# Patient Record
Sex: Male | Born: 1960 | Race: Black or African American | Hispanic: No | Smoking: Former smoker
Health system: Southern US, Community
[De-identification: ages and names within clinical notes are randomized; demographics above are authoritative.]

## PROBLEM LIST (undated history)

## (undated) DIAGNOSIS — I10 Essential (primary) hypertension: Secondary | ICD-10-CM

## (undated) DIAGNOSIS — E785 Hyperlipidemia, unspecified: Secondary | ICD-10-CM

## (undated) DIAGNOSIS — G8929 Other chronic pain: Secondary | ICD-10-CM

## (undated) HISTORY — DX: Other chronic pain: G89.29

## (undated) HISTORY — PX: BACK SURGERY: SHX140

## (undated) HISTORY — DX: Hyperlipidemia, unspecified: E78.5

## (undated) HISTORY — PX: HERNIA REPAIR: SHX51

---

## 2003-10-23 ENCOUNTER — Emergency Department (HOSPITAL_COMMUNITY): Admission: EM | Admit: 2003-10-23 | Discharge: 2003-10-23 | Payer: Self-pay | Admitting: Emergency Medicine

## 2003-10-30 ENCOUNTER — Ambulatory Visit (HOSPITAL_COMMUNITY): Admission: RE | Admit: 2003-10-30 | Discharge: 2003-10-30 | Payer: Self-pay | Admitting: Family Medicine

## 2004-06-06 ENCOUNTER — Ambulatory Visit (HOSPITAL_COMMUNITY): Admission: RE | Admit: 2004-06-06 | Discharge: 2004-06-06 | Payer: Self-pay | Admitting: Family Medicine

## 2004-06-13 ENCOUNTER — Ambulatory Visit (HOSPITAL_COMMUNITY): Admission: RE | Admit: 2004-06-13 | Discharge: 2004-06-13 | Payer: Self-pay | Admitting: Family Medicine

## 2004-08-07 ENCOUNTER — Encounter (INDEPENDENT_AMBULATORY_CARE_PROVIDER_SITE_OTHER): Payer: Self-pay | Admitting: Internal Medicine

## 2004-08-07 LAB — CONVERTED CEMR LAB: PSA: NEGATIVE ng/mL

## 2004-08-24 ENCOUNTER — Ambulatory Visit (HOSPITAL_COMMUNITY): Admission: RE | Admit: 2004-08-24 | Discharge: 2004-08-24 | Payer: Self-pay | Admitting: Urology

## 2004-10-04 ENCOUNTER — Ambulatory Visit (HOSPITAL_COMMUNITY): Admission: RE | Admit: 2004-10-04 | Discharge: 2004-10-04 | Payer: Self-pay | Admitting: General Surgery

## 2004-10-05 ENCOUNTER — Emergency Department (HOSPITAL_COMMUNITY): Admission: EM | Admit: 2004-10-05 | Discharge: 2004-10-05 | Payer: Self-pay | Admitting: Emergency Medicine

## 2004-10-10 ENCOUNTER — Ambulatory Visit (HOSPITAL_COMMUNITY): Admission: RE | Admit: 2004-10-10 | Discharge: 2004-10-10 | Payer: Self-pay | Admitting: Urology

## 2004-11-21 ENCOUNTER — Ambulatory Visit (HOSPITAL_COMMUNITY): Admission: RE | Admit: 2004-11-21 | Discharge: 2004-11-21 | Payer: Self-pay | Admitting: Urology

## 2005-01-09 ENCOUNTER — Ambulatory Visit (HOSPITAL_COMMUNITY): Admission: RE | Admit: 2005-01-09 | Discharge: 2005-01-09 | Payer: Self-pay | Admitting: Urology

## 2005-01-22 IMAGING — CR DG LUMBAR SPINE COMPLETE 4+V
5 series · 5 of 5 positions shown · non-contrast
Comparison: none

CLINICAL DATA: Low back pain.
 LUMBAR SPINE FOUR VIEWS
 There are no prior studies for comparison.
 There is no evidence of lumbar spine fracture or subluxation.  Severe degenerative disk disease and facet DJD is seen at L5-S1.  Mild retrolisthesis is seen at L5-S1 measuring approximately 5 mm.  This is attributed to the degenerative changes seen at this level.  
 IMPRESSION
 1.  No acute radiographic findings.
 2.  Severe degenerative disk disease and facet DJD at L5-S1 with grade I retrolisthesis measuring approximately 5 mm.

[view not recorded (1 of 5)]
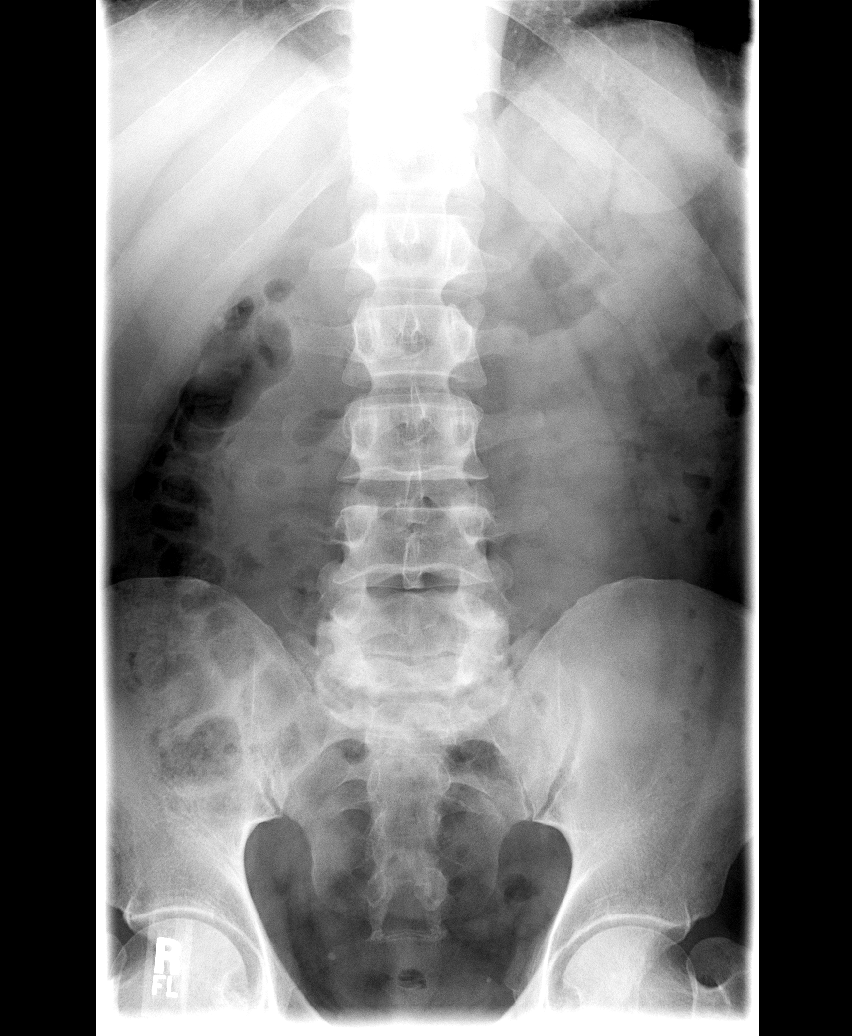

[view not recorded (2 of 5)]
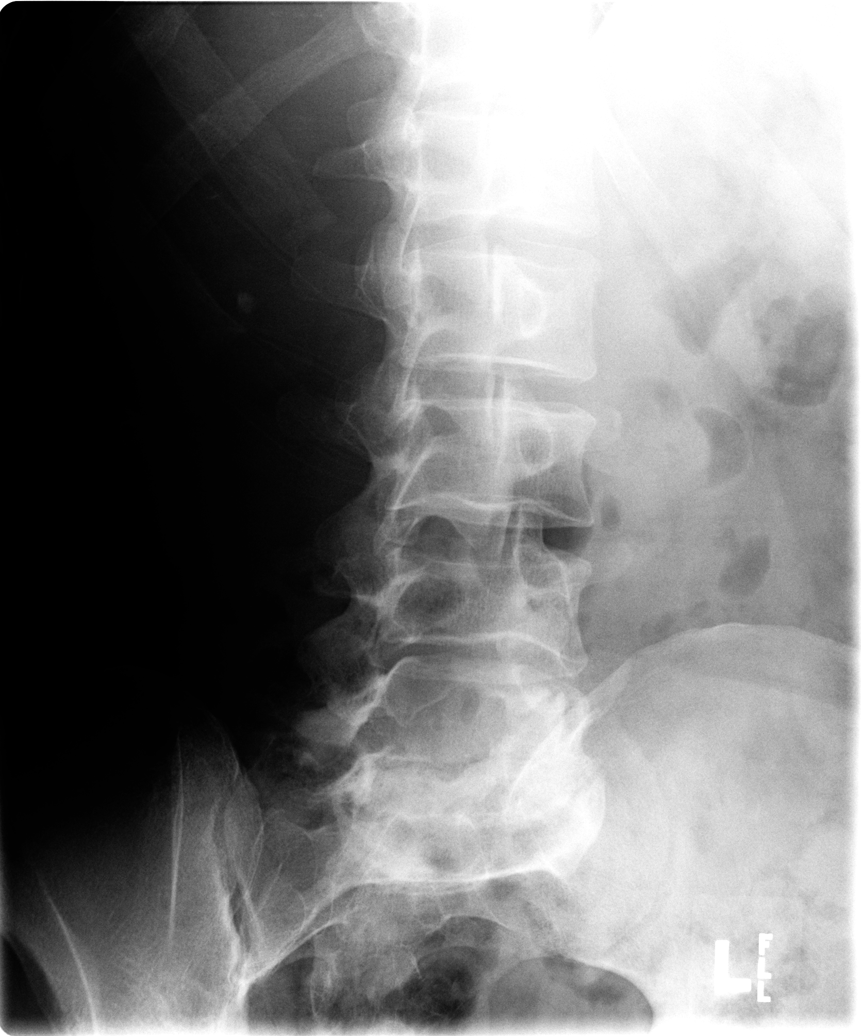

[view not recorded (3 of 5)]
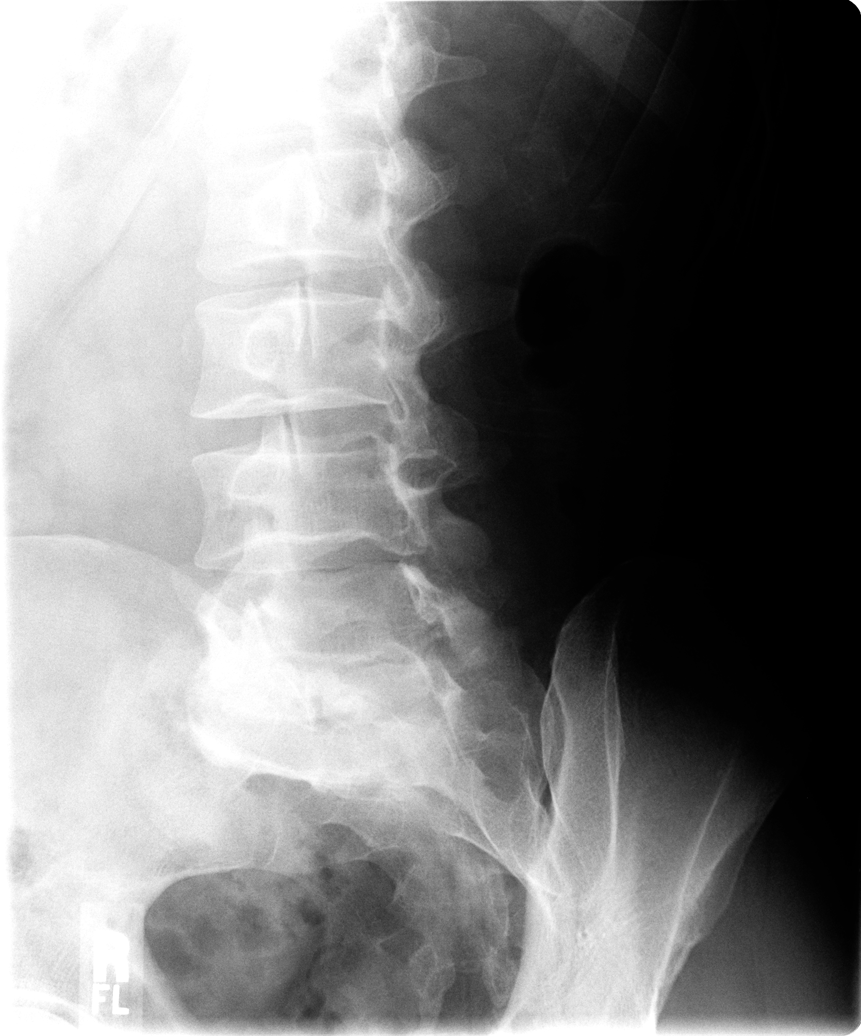

[view not recorded (4 of 5)]
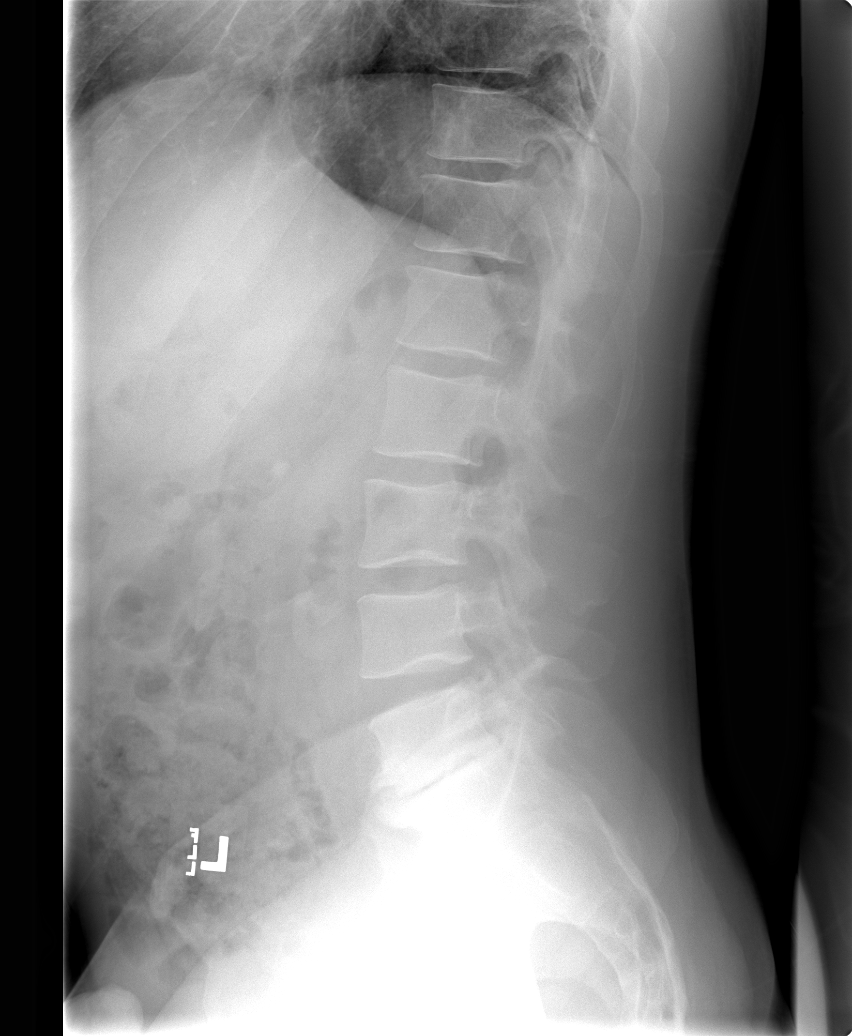

[view not recorded (5 of 5)]
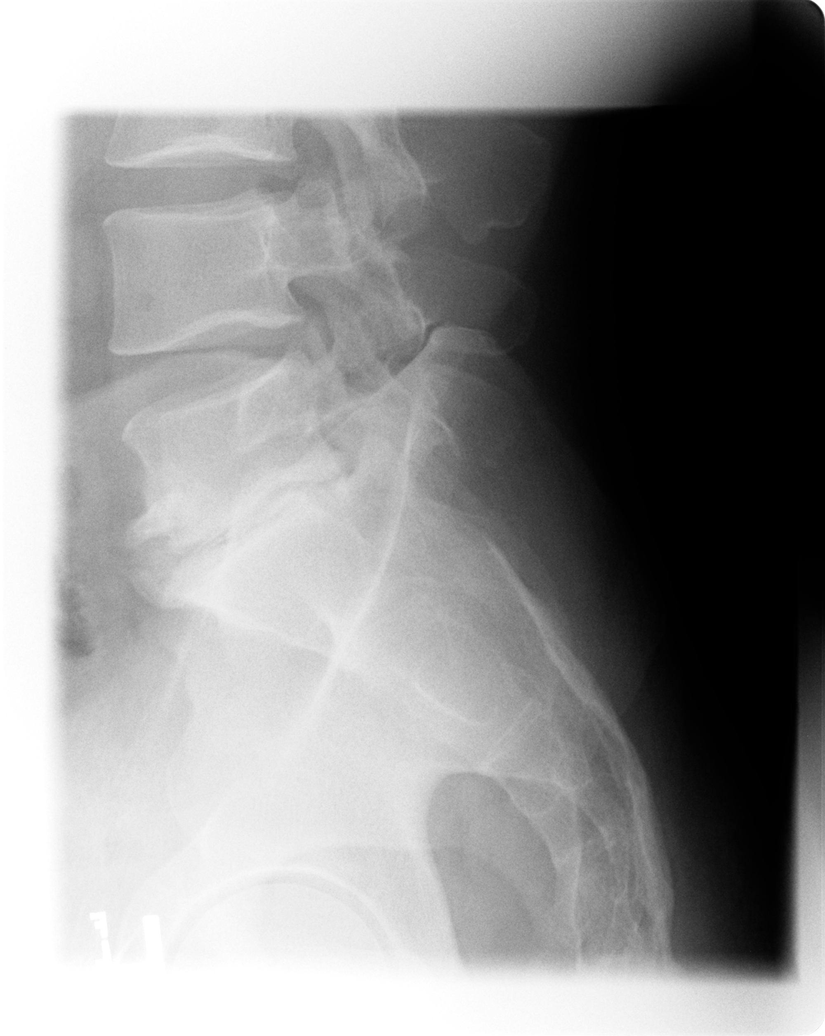

[5 of 5 positions shown; findings below may reference images not displayed]

## 2005-08-23 ENCOUNTER — Ambulatory Visit (HOSPITAL_COMMUNITY): Admission: RE | Admit: 2005-08-23 | Discharge: 2005-08-23 | Payer: Self-pay | Admitting: General Surgery

## 2006-04-12 ENCOUNTER — Emergency Department (HOSPITAL_COMMUNITY): Admission: EM | Admit: 2006-04-12 | Discharge: 2006-04-12 | Payer: Self-pay | Admitting: Emergency Medicine

## 2006-04-20 ENCOUNTER — Ambulatory Visit (HOSPITAL_COMMUNITY): Admission: RE | Admit: 2006-04-20 | Discharge: 2006-04-20 | Payer: Self-pay | Admitting: General Surgery

## 2006-04-30 ENCOUNTER — Ambulatory Visit: Payer: Self-pay | Admitting: Internal Medicine

## 2006-04-30 LAB — CONVERTED CEMR LAB
ALT: 29 units/L
AST: 21 units/L
Albumin: 4.2 g/dL
Alkaline Phosphatase: 152 units/L
Bilirubin, Direct: 0.1 mg/dL
Indirect Bilirubin: 0.7 mg/dL
Total Bilirubin: 0.8 mg/dL
Total Protein: 7.8 g/dL

## 2006-06-15 ENCOUNTER — Ambulatory Visit: Payer: Self-pay | Admitting: Internal Medicine

## 2006-06-15 LAB — CONVERTED CEMR LAB
Ketones, ur: NEGATIVE mg/dL
Leukocytes, UA: NEGATIVE
Nitrite: NEGATIVE
Specific Gravity, Urine: 1.025
Urine Glucose: NEGATIVE mg/dL
Urobilinogen, UA: 2
pH: 6.5

## 2006-08-06 ENCOUNTER — Emergency Department (HOSPITAL_COMMUNITY): Admission: EM | Admit: 2006-08-06 | Discharge: 2006-08-06 | Payer: Self-pay | Admitting: Emergency Medicine

## 2006-08-27 ENCOUNTER — Emergency Department (HOSPITAL_COMMUNITY): Admission: EM | Admit: 2006-08-27 | Discharge: 2006-08-27 | Payer: Self-pay | Admitting: Emergency Medicine

## 2006-09-03 ENCOUNTER — Encounter: Payer: Self-pay | Admitting: Internal Medicine

## 2006-09-03 DIAGNOSIS — M545 Low back pain, unspecified: Secondary | ICD-10-CM | POA: Insufficient documentation

## 2006-09-03 DIAGNOSIS — I10 Essential (primary) hypertension: Secondary | ICD-10-CM | POA: Insufficient documentation

## 2006-09-03 DIAGNOSIS — E785 Hyperlipidemia, unspecified: Secondary | ICD-10-CM | POA: Insufficient documentation

## 2006-10-12 ENCOUNTER — Ambulatory Visit: Payer: Self-pay | Admitting: Internal Medicine

## 2006-10-12 DIAGNOSIS — Z720 Tobacco use: Secondary | ICD-10-CM | POA: Insufficient documentation

## 2006-10-12 DIAGNOSIS — F17200 Nicotine dependence, unspecified, uncomplicated: Secondary | ICD-10-CM | POA: Insufficient documentation

## 2006-10-16 LAB — CONVERTED CEMR LAB
BUN: 21 mg/dL (ref 6–23)
CO2: 23 meq/L (ref 19–32)
Calcium: 9.7 mg/dL (ref 8.4–10.5)
Chloride: 102 meq/L (ref 96–112)
Cholesterol: 168 mg/dL (ref 0–200)
Creatinine, Ser: 1.13 mg/dL (ref 0.40–1.50)
Glucose, Bld: 193 mg/dL — ABNORMAL HIGH (ref 70–99)
HDL: 32 mg/dL — ABNORMAL LOW (ref >39)
LDL Cholesterol: 112 mg/dL — ABNORMAL HIGH (ref 0–99)
Potassium: 3.4 meq/L — ABNORMAL LOW (ref 3.5–5.3)
Sodium: 140 meq/L (ref 135–145)
Total CHOL/HDL Ratio: 5.3
Triglycerides: 119 mg/dL (ref <150)
VLDL: 24 mg/dL (ref 0–40)

## 2006-10-31 ENCOUNTER — Encounter (INDEPENDENT_AMBULATORY_CARE_PROVIDER_SITE_OTHER): Payer: Self-pay | Admitting: Internal Medicine

## 2006-11-01 LAB — CONVERTED CEMR LAB
Glucose, Bld: 93 mg/dL (ref 70–99)
Potassium: 4.1 meq/L (ref 3.5–5.3)

## 2007-03-12 ENCOUNTER — Ambulatory Visit: Payer: Self-pay | Admitting: Internal Medicine

## 2007-03-13 ENCOUNTER — Telehealth (INDEPENDENT_AMBULATORY_CARE_PROVIDER_SITE_OTHER): Payer: Self-pay | Admitting: *Deleted

## 2007-03-13 ENCOUNTER — Encounter (INDEPENDENT_AMBULATORY_CARE_PROVIDER_SITE_OTHER): Payer: Self-pay | Admitting: Internal Medicine

## 2007-03-13 LAB — CONVERTED CEMR LAB
BUN: 11 mg/dL (ref 6–23)
CO2: 24 meq/L (ref 19–32)
Calcium: 9.6 mg/dL (ref 8.4–10.5)
Chloride: 103 meq/L (ref 96–112)
Creatinine, Ser: 1.12 mg/dL (ref 0.40–1.50)
Glucose, Bld: 85 mg/dL (ref 70–99)
Potassium: 3.7 meq/L (ref 3.5–5.3)
Sodium: 139 meq/L (ref 135–145)

## 2009-02-05 ENCOUNTER — Encounter (INDEPENDENT_AMBULATORY_CARE_PROVIDER_SITE_OTHER): Payer: Self-pay | Admitting: Internal Medicine

## 2010-08-27 ENCOUNTER — Encounter: Payer: Self-pay | Admitting: Family Medicine

## 2010-09-06 ENCOUNTER — Emergency Department (HOSPITAL_COMMUNITY)
Admission: EM | Admit: 2010-09-06 | Discharge: 2010-09-06 | Payer: Self-pay | Source: Home / Self Care | Admitting: Emergency Medicine

## 2010-09-06 LAB — URINALYSIS, ROUTINE W REFLEX MICROSCOPIC
Bilirubin Urine: NEGATIVE
Ketones, ur: NEGATIVE mg/dL
Leukocytes, UA: NEGATIVE
Nitrite: NEGATIVE
Protein, ur: NEGATIVE mg/dL
Specific Gravity, Urine: <1.005 — ABNORMAL LOW (ref 1.005–1.030)
Urine Glucose, Fasting: NEGATIVE mg/dL
Urobilinogen, UA: 0.2 mg/dL (ref 0.0–1.0)
pH: 5.5 (ref 5.0–8.0)

## 2010-09-06 LAB — DIFFERENTIAL
Basophils Absolute: 0 10*3/uL (ref 0.0–0.1)
Basophils Relative: 1 % (ref 0–1)
Eosinophils Absolute: 0.1 10*3/uL (ref 0.0–0.7)
Eosinophils Relative: 2 % (ref 0–5)
Lymphocytes Relative: 40 % (ref 12–46)
Lymphs Abs: 2.4 10*3/uL (ref 0.7–4.0)
Monocytes Absolute: 0.7 10*3/uL (ref 0.1–1.0)
Monocytes Relative: 12 % (ref 3–12)
Neutro Abs: 2.8 10*3/uL (ref 1.7–7.7)
Neutrophils Relative %: 47 % (ref 43–77)

## 2010-09-06 LAB — URINE MICROSCOPIC-ADD ON

## 2010-09-06 LAB — CBC
HCT: 44.6 % (ref 39.0–52.0)
Hemoglobin: 15.8 g/dL (ref 13.0–17.0)
MCH: 33.1 pg (ref 26.0–34.0)
MCHC: 35.4 g/dL (ref 30.0–36.0)
MCV: 93.5 fL (ref 78.0–100.0)
Platelets: 247 10*3/uL (ref 150–400)
RBC: 4.77 MIL/uL (ref 4.22–5.81)
RDW: 12.6 % (ref 11.5–15.5)
WBC: 6 10*3/uL (ref 4.0–10.5)

## 2010-09-06 LAB — BASIC METABOLIC PANEL
BUN: 15 mg/dL (ref 6–23)
CO2: 25 mEq/L (ref 19–32)
Calcium: 9.3 mg/dL (ref 8.4–10.5)
Chloride: 102 mEq/L (ref 96–112)
Creatinine, Ser: 1.14 mg/dL (ref 0.4–1.5)
GFR calc Af Amer: >60 mL/min (ref >60)
GFR calc non Af Amer: >60 mL/min (ref >60)
Glucose, Bld: 98 mg/dL (ref 70–99)
Potassium: 3.3 mEq/L — ABNORMAL LOW (ref 3.5–5.1)
Sodium: 137 mEq/L (ref 135–145)

## 2010-09-06 LAB — RAPID URINE DRUG SCREEN, HOSP PERFORMED
Amphetamines: NOT DETECTED
Barbiturates: NOT DETECTED
Benzodiazepines: NOT DETECTED
Cocaine: NOT DETECTED
Opiates: NOT DETECTED
Tetrahydrocannabinol: NOT DETECTED

## 2010-09-06 LAB — ETHANOL: Alcohol, Ethyl (B): 79 mg/dL — ABNORMAL HIGH (ref 0–10)

## 2010-12-23 NOTE — Op Note (Signed)
NAME:  Antonio Small, Antonio Small              ACCOUNT NO.:  [id number]   MEDICAL RECORD NO.:  [id number]          PATIENT TYPE:  AMB   LOCATION:  DAY                           FACILITY:  [facility]   PHYSICIAN:  Jerolyn Shin C. Katrinka Blazing, M.D.   DATE OF BIRTH:  1960-10-13   DATE OF PROCEDURE:  10/04/2004  DATE OF DISCHARGE:  10/04/2004                                 OPERATIVE REPORT   PREOPERATIVE DIAGNOSIS:  Left inguinal hernia.   POSTOPERATIVE DIAGNOSIS:  Left inguinal hernia,   PROCEDURE:  Left inguinal hernia repair.   SURGEON:  Elpidio Anis, M.D.   DESCRIPTION:  Under general LMA anesthesia, the patient's left inguinal area  and genitalia were prepped and draped in a sterile field.  A curvilinear  lower inguinal incision was made.  Incision extended to the aponeurosis.  The aponeurosis was opened in the line of its fibers through the internal  ring and external ring.  The cord was mobilized from the floor.  The hernia  sac was identified and was bluntly dissected from the cord.  This dissection  was carried back to the internal ring.  The cord was invaginated and a  medium plug was placed in the internal ring.  The plug was sutured in place  with interrupted 0 Prolene.  Next, an onlay patch was placed under the cord  and was sutured to with 0 Prolene.  The aponeurosis was closed with running  3-0 Monocryl.  The subcutaneous tissue closed with running 3-0 Monocryl.  The skin was closed with 4-0 Dexon.  The wound was infiltrated with 20 mL of  Marcaine 0.5% with epinephrine 1:200,000.  OpSite dressing was placed.  The  patient tolerated the procedure well.  He was transferred to a bed and taken  to the postanesthetic care unit for monitoring.      LCS/MEDQ  D:  11/06/2004  T:  11/07/2004  Job:  [id number]

## 2010-12-23 NOTE — Op Note (Signed)
NAME:  Antonio Small, Antonio Small              ACCOUNT NO.:  [id number]   MEDICAL RECORD NO.:  [id number]          PATIENT TYPE:  AMB   LOCATION:  DAY                           FACILITY:  [facility]   PHYSICIAN:  Dalia Heading, M.D.  DATE OF BIRTH:  1961-01-31   DATE OF PROCEDURE:  04/20/2006  DATE OF DISCHARGE:                                 OPERATIVE REPORT   PREOPERATIVE DIAGNOSIS:  Recurrent left inguinal hernia.   POSTOPERATIVE DIAGNOSIS:  Recurrent left inguinal hernia, granuloma.   PROCEDURE:  Recurrent left inguinal herniorrhaphy.   SURGEON:  Dr. Franky Macho.   ANESTHESIA:  General endotracheal.   INDICATIONS:  The patient is a 50 year old black male status post left  inguinal herniorrhaphy in the past who presents with a recurrent mass and  pain in the left inguinal region.  The risks and benefits of the procedure  including bleeding, pain, infection, and recurrence of the hernia were fully  explained to the patient, who gave informed consent.   PROCEDURE NOTE:  The patient was placed in supine position.  After induction  of general endotracheal anesthesia, the left groin region was prepped and  draped using the usual sterile technique with Betadine.  Surgical site  confirmation was performed.   A left groin incision was made down to the external oblique aponeurosis.  The aponeurosis was incised.  The mass that was palpable was noted to be the  polypropylene mesh plug that had been previously placed in the internal  ring.  Granulomatous tissue and old Prolene sutures were found and removed.  The plug was removed as much as I could to get rid of the granulomatous  tissue.  The internal ring was then closed using 2-0 Novofil interrupted  sutures.  The external oblique aponeurosis was reapproximated using a 2-0  Vicryl running suture, and subcutaneous layer was closed using a 3-0 Vicryl  interrupted suture.  The skin was closed using a 4-0 Vicryl subcuticular  suture.  Sensorcaine  0.5% was instilled into the surrounding wound.  Dermabond was then applied.   All tape and needle counts were correct at the end of the procedure.  The  patient was extubated in the operating room and went back to recovery room  awake in stable condition.   COMPLICATIONS:  None.   SPECIMEN:  None.   BLOOD LOSS:  Minimal.      Dalia Heading, M.D.  Electronically Signed     MAJ/MEDQ  D:  04/20/2006  T:  04/21/2006  Job:  [id number]   cc:   Kandice Hams

## 2010-12-23 NOTE — H&P (Signed)
NAME:  Antonio Small, COUPE              ACCOUNT NO.:  [id number]   MEDICAL RECORD NO.:  [id number]          PATIENT TYPE:  AMB   LOCATION:  DAY                           FACILITY:  [facility]   PHYSICIAN:  Jerolyn Shin C. Katrinka Blazing, M.D.   DATE OF BIRTH:  03-21-1961   DATE OF ADMISSION:  DATE OF DISCHARGE:  LH                                HISTORY & PHYSICAL   HISTORY OF PRESENT ILLNESS:  A 50 year old male with history of left  inguinal swelling.  The swelling has become more prominent, and he has had  more pain.  When seen in the office, he was noted to have a left inguinal  hernia.  He is scheduled to have hernia repair.   PAST HISTORY:  1.  He has hypertension.  2.  Chronic low back pain due to lumbar disk disease.  3.  History of kidney stones.  4.  Hyperlipidemia.   MEDICATIONS:  1.  Diovan 100/25 daily.  2.  Lortab 7.5 mg t.i.d.  3.  Wellbutrin SR 150 mg b.i.d.  4.  Aspirin 81 mg daily.  5.  Lotrel 5/10 mg daily.  6.  Levitra 20 mg p.r.n.   ALLERGIES:  None.   FAMILY HISTORY:  Positive for coronary artery disease, stroke, and dementia.   PHYSICAL EXAMINATION:  VITAL SIGNS:  Blood pressure 120/80, pulse 88,  respirations 20, weight 172 pounds.  HEENT:  Unremarkable.  NECK:  Supple.  No JVD, bruit, adenopathy, or thyromegaly.  CHEST:  Clear to auscultation.  HEART:  Regular rate and rhythm without murmur, gallop, or rub.  ABDOMEN:  Soft, nontender.  No masses.  Moderate-sized left inguinal hernia  without incarceration.  EXTREMITIES:  No clubbing, cyanosis, or edema.  BACK:  A healed lumbar scar with tenderness of the lower lumbar muscle  bilaterally.  NEUROLOGIC:  No focal motor, sensory, or cerebellar deficits.   IMPRESSION:  1.  Left inguinal hernia.  2.  Hypertension.  3.  Chronic low back pain due to lumbar disk disease.  4.  History of kidney stones.  5.  Hyperlipidemia.   PLAN:  Left inguinal hernia repair.      LCS/MEDQ  D:  10/03/2004  T:  10/04/2004  Job:  [id number]

## 2010-12-23 NOTE — H&P (Signed)
NAME:  Antonio Small, Antonio Small              ACCOUNT NO.:  [id number]   MEDICAL RECORD NO.:  [id number]          PATIENT TYPE:  AMB   LOCATION:  DAY                           FACILITY:  [facility]   PHYSICIAN:  Dalia Heading, M.D.  DATE OF BIRTH:  1960-12-05   DATE OF ADMISSION:  04/20/2006  DATE OF DISCHARGE:  LH                                HISTORY & PHYSICAL   CHIEF COMPLAINT:  Recurrent left inguinal hernia.   HISTORY OF PRESENT ILLNESS:  The patient is a 50 year old black male, who is  referred for evaluation and treatment of a left inguinal hernia.  He was  seen in the emergency room last week, and the hernia was reduced.  He is  status post a left inguinal herniorrhaphy by Dr. Katrinka Blazing in the remote past.  He is still having some pain in that region currently.  The bulge is made  worse with straining.   PAST MEDICAL HISTORY:  Includes hypertension and chronic back pain.   PAST SURGICAL HISTORY:  Back surgery, left inguinal herniorrhaphy.   CURRENT MEDICATIONS:  A blood pressure pill.   ALLERGIES:  No known drug allergies.   REVIEW OF SYSTEMS:  The patient smokes 2 pack of cigarettes a day.  He  drinks alcohol socially.   PHYSICAL EXAMINATION:  GENERAL:  The patient is a well-developed, well-  nourished black male in no acute distress.  LUNGS:  Clear to auscultation with equal breath sounds bilaterally.  HEART:  Regular rate and rhythm without S3, S4, or murmurs.  ABDOMEN:  The abdomen is soft and nondistended.  He is tender in the left  groin region with a left inguinal hernia present.  No hepatosplenomegaly or  masses are noted.   IMPRESSION:  Recurrent left inguinal hernia.   PLAN:  The patient is scheduled for a recurrent left inguinal herniorrhaphy  on April 20, 2006.  The risks and benefits of the procedure including  bleeding, infection, and recurrence of the hernia were fully explained to  the patient, gave informed consent.      Dalia Heading, M.D.  Electronically Signed     MAJ/MEDQ  D:  04/17/2006  T:  04/17/2006  Job:  [id number]   cc:   [facility]  Fax: [phone]   Erle Crocker, MD

## 2012-06-26 ENCOUNTER — Emergency Department (HOSPITAL_COMMUNITY)
Admission: EM | Admit: 2012-06-26 | Discharge: 2012-06-26 | Disposition: A | Discharge status: Home / Self Care | Payer: Medicare Other | Attending: Emergency Medicine | Admitting: Emergency Medicine

## 2012-06-26 ENCOUNTER — Encounter (HOSPITAL_COMMUNITY): Payer: Self-pay | Admitting: *Deleted

## 2012-06-26 ENCOUNTER — Emergency Department (HOSPITAL_COMMUNITY): Payer: Medicare Other

## 2012-06-26 DIAGNOSIS — F17200 Nicotine dependence, unspecified, uncomplicated: Secondary | ICD-10-CM | POA: Insufficient documentation

## 2012-06-26 DIAGNOSIS — R6883 Chills (without fever): Secondary | ICD-10-CM | POA: Insufficient documentation

## 2012-06-26 DIAGNOSIS — I10 Essential (primary) hypertension: Secondary | ICD-10-CM | POA: Insufficient documentation

## 2012-06-26 DIAGNOSIS — J40 Bronchitis, not specified as acute or chronic: Secondary | ICD-10-CM

## 2012-06-26 HISTORY — DX: Essential (primary) hypertension: I10

## 2012-06-26 MED ORDER — AMOXICILLIN 500 MG PO CAPS: 500.0000 mg | ORAL_CAPSULE | Freq: Three times a day (TID) | ORAL | Status: DC

## 2012-06-26 MED ORDER — ALBUTEROL SULFATE HFA 108 (90 BASE) MCG/ACT IN AERS
2.0000 | INHALATION_SPRAY | RESPIRATORY_TRACT | Status: DC | PRN
  Administered 2012-06-26: 2 via RESPIRATORY_TRACT
  Filled 2012-06-26: qty 6.7

## 2012-06-26 MED ORDER — ACETAMINOPHEN 500 MG PO TABS
ORAL_TABLET | ORAL | Status: AC
  Administered 2012-06-26: 1000 mg
  Filled 2012-06-26: qty 2

## 2012-06-26 NOTE — ED Notes (Signed)
Cough, fever, body aches for 3 weeks, fever, NVD,

## 2012-06-26 NOTE — ED Notes (Signed)
Patient transported to X-ray 

## 2012-06-26 NOTE — ED Provider Notes (Signed)
History   This chart was scribed for Antonio Lennert, MD by Antonio Small, ED Scribe. This patient was seen in room APA11/[facility]11 and the patient's care was started at 3:26 PM.   CSN: [id number]  Arrival date & time 06/26/12  1328   None     Chief Complaint  Patient presents with  . Cough    Patient is a 51 y.o. male presenting with cough. The history is provided by the patient. No language interpreter was used.  Cough The current episode started more than 1 week ago. The problem occurs hourly. The problem has not changed since onset.The cough is productive of sputum. Associated symptoms include chills, shortness of breath and wheezing. Pertinent negatives include no chest pain and no headaches. He has tried nothing for the symptoms. He is a smoker.    Antonio Small is a 51 y.o. male who presents to the Emergency Department complaining of unchanged, gradual onset, intermittent productive of sputum cough onset three weeks ago. There is associated fever, SOB, wheezing, chills and body aches. Patient denies abdominal pain and back pain. He denies any other associated symptoms and complaints. Patient otherwise healthy. He states that he has not been smoking for 3 weeks but he used to smoke a pack a day.  He denies seeing his PCP for his symptoms.    Past Medical History  Diagnosis Date  . Hypertension     Past Surgical History  Procedure Date  . Back surgery   . Hernia repair     History reviewed. No pertinent family history.  History  Substance Use Topics  . Smoking status: Current Every Day Smoker  . Smokeless tobacco: Not on file  . Alcohol Use: Yes      Review of Systems  Constitutional: Positive for fever and chills. Negative for fatigue.  HENT: Negative for congestion, sinus pressure and ear discharge.   Eyes: Negative for discharge.  Respiratory: Positive for shortness of breath and wheezing. Negative for cough.   Cardiovascular: Negative for chest pain.    Gastrointestinal: Negative for nausea, vomiting, abdominal pain and diarrhea.  Genitourinary: Negative for frequency and hematuria.  Musculoskeletal: Negative for back pain.  Skin: Negative for rash.  Neurological: Negative for seizures and headaches.  Hematological: Negative.   Psychiatric/Behavioral: Negative for hallucinations.    Allergies  Review of patient's allergies indicates no known allergies.  Home Medications  No current outpatient prescriptions on file.  BP 138/71  Pulse 107  Temp 103.3 F (39.6 C) (Oral)  Resp 22  Ht 5\' 9"  (1.753 m)  Wt 165 lb (74.844 kg)  BMI 24.37 kg/m2  SpO2 100%  Physical Exam  Nursing note and vitals reviewed. Constitutional: He is oriented to person, place, and time. He appears well-developed and well-nourished.  HENT:  Head: Normocephalic and atraumatic.  Eyes: Conjunctivae normal and EOM are normal. Pupils are equal, round, and reactive to light.  Neck: Normal range of motion. Neck supple.  Cardiovascular: Normal rate, regular rhythm and normal heart sounds.   Pulmonary/Chest: He has wheezes.       Minimal wheezing bilaterally.  Abdominal: Soft. Bowel sounds are normal.  Musculoskeletal: Normal range of motion.  Neurological: He is alert and oriented to person, place, and time.  Skin: Skin is warm and dry.  Psychiatric: He has a normal mood and affect.    ED Course  Procedures (including critical care time)  DIAGNOSTIC STUDIES: Oxygen Saturation is 100% on room air, normal by my interpretation.  COORDINATION OF CARE: 3:25 PM Discussed treatment plan with pt at bedside and pt agreed to plan.   Labs Reviewed - No data to display Dg Chest 2 View  06/26/2012  *RADIOLOGY REPORT*  Clinical Data: Cough and shortness of breath.  Abdominal pain and fever for 3 weeks.  Smoker.  CHEST - 2 VIEW  Comparison: 01/17/7 and CT of 09/06/2010  Findings: Lateral view mildly obliqued.  Emphysema, greater on the left than right.  Upper lobe  predominant and biapical pleural parenchymal scarring.  Midline trachea.  Normal heart size and mediastinal contours. No pleural effusion or pneumothorax.  IMPRESSION: Emphysema and scarring. No acute superimposed process.   Original Report Authenticated By: Jeronimo Greaves, M.D.      No diagnosis found.    MDM  The chart was scribed for me under my direct supervision.  I personally performed the history, physical, and medical decision making and all procedures in the evaluation of this patient.Antonio Lennert, MD 06/26/12 1600

## 2019-10-18 ENCOUNTER — Ambulatory Visit: Payer: Medicare Other | Attending: Internal Medicine

## 2019-10-18 DIAGNOSIS — Z23 Encounter for immunization: Secondary | ICD-10-CM

## 2019-10-18 NOTE — Progress Notes (Signed)
   Covid-19 Vaccination Clinic  Name:  Antonio Small    MRN: [id number] DOB: Dec 10, 1960  10/18/2019  Mr. Antonio Small was observed post Covid-19 immunization for 15 minutes without incident. He was provided with Vaccine Information Sheet and instruction to access the V-Safe system.   Mr. Antonio Small was instructed to call 911 with any severe reactions post vaccine: Marland Kitchen Difficulty breathing  . Swelling of face and throat  . A fast heartbeat  . A bad rash all over body  . Dizziness and weakness   Immunizations Administered    Name Date Dose VIS Date Route   Moderna COVID-19 Vaccine 10/18/2019  9:36 AM 0.5 mL 07/08/2019 Intramuscular   Manufacturer: Moderna   Lot: [id number]   NDC: 03128-118-86

## 2019-11-19 ENCOUNTER — Ambulatory Visit: Payer: Medicare Other | Attending: Internal Medicine

## 2019-11-19 DIAGNOSIS — Z23 Encounter for immunization: Secondary | ICD-10-CM

## 2019-11-19 NOTE — Progress Notes (Signed)
   Covid-19 Vaccination Clinic  Name:  Antonio Small    MRN: [id number] DOB: Feb 13, 1961  11/19/2019  Mr. Antonio Small was observed post Covid-19 immunization for 15 minutes without incident. He was provided with Vaccine Information Sheet and instruction to access the V-Safe system.   Mr. Antonio Small was instructed to call 911 with any severe reactions post vaccine: Marland Kitchen Difficulty breathing  . Swelling of face and throat  . A fast heartbeat  . A bad rash all over body  . Dizziness and weakness   Immunizations Administered    Name Date Dose VIS Date Route   Moderna COVID-19 Vaccine 11/19/2019  8:47 AM 0.5 mL 07/08/2019 Intramuscular   Manufacturer: Moderna   Lot: [id number]   NDC: 60600-459-97

## 2020-01-10 ENCOUNTER — Emergency Department (HOSPITAL_COMMUNITY): Payer: Medicaid Other

## 2020-01-10 ENCOUNTER — Emergency Department (HOSPITAL_COMMUNITY)
Admission: EM | Admit: 2020-01-10 | Discharge: 2020-01-10 | Disposition: A | Discharge status: Home / Self Care | Payer: Medicaid Other | Attending: Emergency Medicine | Admitting: Emergency Medicine

## 2020-01-10 ENCOUNTER — Encounter (HOSPITAL_COMMUNITY): Payer: Self-pay | Admitting: Emergency Medicine

## 2020-01-10 ENCOUNTER — Ambulatory Visit
Admission: EM | Admit: 2020-01-10 | Discharge: 2020-01-10 | Disposition: A | Discharge status: Home / Self Care | Payer: Medicaid Other

## 2020-01-10 ENCOUNTER — Other Ambulatory Visit: Payer: Self-pay

## 2020-01-10 DIAGNOSIS — F17210 Nicotine dependence, cigarettes, uncomplicated: Secondary | ICD-10-CM | POA: Insufficient documentation

## 2020-01-10 DIAGNOSIS — Z20822 Contact with and (suspected) exposure to covid-19: Secondary | ICD-10-CM | POA: Insufficient documentation

## 2020-01-10 DIAGNOSIS — R42 Dizziness and giddiness: Secondary | ICD-10-CM | POA: Insufficient documentation

## 2020-01-10 DIAGNOSIS — I10 Essential (primary) hypertension: Secondary | ICD-10-CM | POA: Diagnosis present

## 2020-01-10 DIAGNOSIS — I160 Hypertensive urgency: Secondary | ICD-10-CM | POA: Diagnosis present

## 2020-01-10 LAB — CBC WITH DIFFERENTIAL/PLATELET
Abs Immature Granulocytes: 0 10*3/uL (ref 0.00–0.07)
Basophils Absolute: 0 10*3/uL (ref 0.0–0.1)
Basophils Relative: 1 %
Eosinophils Absolute: 0.2 10*3/uL (ref 0.0–0.5)
Eosinophils Relative: 3 %
HCT: 46.8 % (ref 39.0–52.0)
Hemoglobin: 15.3 g/dL (ref 13.0–17.0)
Immature Granulocytes: 0 %
Lymphocytes Relative: 32 %
Lymphs Abs: 1.6 10*3/uL (ref 0.7–4.0)
MCH: 31.8 pg (ref 26.0–34.0)
MCHC: 32.7 g/dL (ref 30.0–36.0)
MCV: 97.3 fL (ref 80.0–100.0)
Monocytes Absolute: 0.7 10*3/uL (ref 0.1–1.0)
Monocytes Relative: 15 %
Neutro Abs: 2.4 10*3/uL (ref 1.7–7.7)
Neutrophils Relative %: 49 %
Platelets: 261 10*3/uL (ref 150–400)
RBC: 4.81 MIL/uL (ref 4.22–5.81)
RDW: 13.2 % (ref 11.5–15.5)
WBC: 4.8 10*3/uL (ref 4.0–10.5)
nRBC: 0 % (ref 0.0–0.2)

## 2020-01-10 LAB — SARS CORONAVIRUS 2 BY RT PCR (HOSPITAL ORDER, PERFORMED IN ~~LOC~~ HOSPITAL LAB): SARS Coronavirus 2: NEGATIVE

## 2020-01-10 LAB — URINALYSIS, ROUTINE W REFLEX MICROSCOPIC
Bacteria, UA: NONE SEEN
Bilirubin Urine: NEGATIVE
Glucose, UA: NEGATIVE mg/dL
Hgb urine dipstick: NEGATIVE
Ketones, ur: NEGATIVE mg/dL
Leukocytes,Ua: NEGATIVE
Nitrite: NEGATIVE
Protein, ur: 100 mg/dL — AB
Specific Gravity, Urine: 1.017 (ref 1.005–1.030)
pH: 6 (ref 5.0–8.0)

## 2020-01-10 LAB — COMPREHENSIVE METABOLIC PANEL
ALT: 18 U/L (ref 0–44)
AST: 22 U/L (ref 15–41)
Albumin: 3.6 g/dL (ref 3.5–5.0)
Alkaline Phosphatase: 97 U/L (ref 38–126)
Anion gap: 9 (ref 5–15)
BUN: 15 mg/dL (ref 6–20)
CO2: 28 mmol/L (ref 22–32)
Calcium: 9 mg/dL (ref 8.9–10.3)
Chloride: 103 mmol/L (ref 98–111)
Creatinine, Ser: 1.16 mg/dL (ref 0.61–1.24)
GFR calc Af Amer: >60 mL/min (ref >60)
GFR calc non Af Amer: >60 mL/min (ref >60)
Glucose, Bld: 110 mg/dL — ABNORMAL HIGH (ref 70–99)
Potassium: 3.2 mmol/L — ABNORMAL LOW (ref 3.5–5.1)
Sodium: 140 mmol/L (ref 135–145)
Total Bilirubin: 0.8 mg/dL (ref 0.3–1.2)
Total Protein: 7.2 g/dL (ref 6.5–8.1)

## 2020-01-10 LAB — TROPONIN I (HIGH SENSITIVITY)
Troponin I (High Sensitivity): 4 ng/L (ref <18)
Troponin I (High Sensitivity): 4 ng/L (ref <18)

## 2020-01-10 LAB — ETHANOL: Alcohol, Ethyl (B): <10 mg/dL (ref <10)

## 2020-01-10 MED ORDER — PRAVASTATIN SODIUM 40 MG PO TABS: 40.0000 mg | ORAL_TABLET | Freq: Every morning | ORAL | 0 refills | Status: DC

## 2020-01-10 MED ORDER — LISINOPRIL-HYDROCHLOROTHIAZIDE 20-12.5 MG PO TABS: 1.0000 | ORAL_TABLET | Freq: Every morning | ORAL | 0 refills | Status: DC

## 2020-01-10 MED ORDER — LISINOPRIL-HYDROCHLOROTHIAZIDE 20-25 MG PO TABS: 1.0000 | ORAL_TABLET | Freq: Every day | ORAL | 0 refills | Status: DC

## 2020-01-10 MED ORDER — LABETALOL HCL 5 MG/ML IV SOLN
5.0000 mg | Freq: Once | INTRAVENOUS | Status: AC
  Administered 2020-01-10: 5 mg via INTRAVENOUS
  Filled 2020-01-10: qty 4

## 2020-01-10 MED ORDER — AMLODIPINE BESYLATE 5 MG PO TABS: 5.0000 mg | ORAL_TABLET | Freq: Every morning | ORAL | 0 refills | Status: DC

## 2020-01-10 NOTE — Consult Note (Signed)
Consult note  ESA RADEN RCV:[id number] DOB: 07-15-1961 DOA: 01/10/2020  Referring physician: Dr Jacqulyn Bath, ED physician PCP: Oval Linsey, MD  Outpatient Specialists:   Patient Coming From: home  Chief Complaint: headache, blurred vision  HPI: Antonio Small is a 59 y.o. male with a history of hypertension.  Recently released from jail about 8 months ago and has not had his blood pressure medications since that time.  He has a longstanding history of headache and blurred vision.  He states that he needs glasses, but has not been able to afford them.  Since being released from jail, patient has not had his blood pressure medication.  Today, he had severe headache and worsening blurred vision.  He had his blood pressure taken at the food kitchen, which was extremely elevated.  He was sent to the emergency department for evaluation.  Patient denied alcohol use.  Emergency Department Course: Initial blood pressure 220/120.  Patient given labetalol 5 mg IV with good response of blood pressure in the 150s over 102.  Labs relatively normal, along with head CT.  The ER doctor noted that he had a wide-based gait and was concerned about stroke.  He consulted me for possible admission.  His headache is currently gone after the blood pressure medication and normalization of his blood pressure, along with his blurred vision.  Review of Systems:   Pt denies any fevers, chills, nausea, vomiting, diarrhea, constipation, abdominal pain, shortness of breath, dyspnea on exertion, orthopnea, cough, wheezing, palpitations, headache, vision changes, lightheadedness, dizziness, melena, rectal bleeding.  Review of systems are otherwise negative  Past Medical History:  Diagnosis Date  . Hypertension    Past Surgical History:  Procedure Laterality Date  . BACK SURGERY    . HERNIA REPAIR     Social History:  reports that he has been smoking cigarettes. He has been smoking about 0.25 packs per day. He has  never used smokeless tobacco. He reports current alcohol use. He reports that he does not use drugs. Patient lives at home  No Known Allergies  History reviewed. No pertinent family history.  Family history of hypertension  Prior to Admission medications   Medication Sig Start Date End Date Taking? Authorizing Provider  aspirin EC 81 MG tablet Take 81 mg by mouth every morning.   Yes [provider]  amLODipine (NORVASC) 5 MG tablet Take 5 mg by mouth every morning.    [provider]  amoxicillin (AMOXIL) 500 MG capsule Take 1 capsule (500 mg total) by mouth 3 (three) times daily. Patient not taking: Reported on 01/10/2020 06/26/12   Bethann Berkshire, MD  lisinopril-hydrochlorothiazide (PRINZIDE,ZESTORETIC) 20-12.5 MG per tablet Take 1 tablet by mouth every morning.    [provider]  pravastatin (PRAVACHOL) 40 MG tablet Take 40 mg by mouth every morning.    [provider]    Physical Exam: BP (!) 157/102   Pulse 64   Temp 98.8 F (37.1 C) (Oral)   Resp 18   Ht 5\' 9"  (1.753 m)   Wt 88.5 kg   SpO2 97%   BMI 28.80 kg/m   . General: Male. Awake and alert and oriented x3. No acute cardiopulmonary distress.  HEENT: Normocephalic atraumatic.  Right and left ears normal in appearance.  Pupils equal, round, reactive to light. Extraocular muscles are intact. Sclerae anicteric and noninjected.  Moist mucosal membranes. No mucosal lesions.  . Neck: Neck supple without lymphadenopathy. No carotid bruits. No masses palpated.  . Cardiovascular:  Regular rate with normal S1-S2 sounds. No murmurs, rubs, gallops auscultated. No JVD.  Marland Kitchen Respiratory: Good respiratory effort with no wheezes, rales, rhonchi. Lungs clear to auscultation bilaterally.  No accessory muscle use. . Abdomen: Soft, nontender, nondistended. Active bowel sounds. No masses or hepatosplenomegaly  . Skin: No rashes, lesions, or ulcerations.  Dry, warm to touch. 2+ dorsalis pedis and radial  pulses. . Musculoskeletal: No calf or leg pain. All major joints not erythematous nontender.  No upper or lower joint deformation.  Good ROM.  No contractures  . Psychiatric: Intact judgment and insight. Pleasant and cooperative. . Neurologic: No focal neurological deficits. Strength is 5/5 and symmetric in upper and lower extremities.  Cranial nerves II through XII are grossly intact.  Coordination intact, finger-to-nose intact, heel-to-shin intact, DTRs 2 out of 4.  Ambulation normal with no wide-based gait.           Labs on Admission: I have personally reviewed following labs and imaging studies  CBC: Recent Labs  Lab 01/10/20 1310  WBC 4.8  NEUTROABS 2.4  HGB 15.3  HCT 46.8  MCV 97.3  PLT 235   Basic Metabolic Panel: Recent Labs  Lab 01/10/20 1310  NA 140  K 3.2*  CL 103  CO2 28  GLUCOSE 110*  BUN 15  CREATININE 1.16  CALCIUM 9.0   GFR: Estimated Creatinine Clearance: 76.4 mL/min (by C-G formula based on SCr of 1.16 mg/dL). Liver Function Tests: Recent Labs  Lab 01/10/20 1310  AST 22  ALT 18  ALKPHOS 97  BILITOT 0.8  PROT 7.2  ALBUMIN 3.6   No results for input(s): LIPASE, AMYLASE in the last 168 hours. No results for input(s): AMMONIA in the last 168 hours. Coagulation Profile: No results for input(s): INR, PROTIME in the last 168 hours. Cardiac Enzymes: No results for input(s): CKTOTAL, CKMB, CKMBINDEX, TROPONINI in the last 168 hours. BNP (last 3 results) No results for input(s): PROBNP in the last 8760 hours. HbA1C: No results for input(s): HGBA1C in the last 72 hours. CBG: No results for input(s): GLUCAP in the last 168 hours. Lipid Profile: No results for input(s): CHOL, HDL, LDLCALC, TRIG, CHOLHDL, LDLDIRECT in the last 72 hours. Thyroid Function Tests: No results for input(s): TSH, T4TOTAL, FREET4, T3FREE, THYROIDAB in the last 72 hours. Anemia Panel: No results for input(s): VITAMINB12, FOLATE, FERRITIN, TIBC, IRON, RETICCTPCT in the last  72 hours. Urine analysis:    Component Value Date/Time   COLORURINE YELLOW 01/10/2020 1302   APPEARANCEUR CLEAR 01/10/2020 1302   LABSPEC 1.017 01/10/2020 1302   PHURINE 6.0 01/10/2020 1302   GLUCOSEU NEGATIVE 01/10/2020 1302   GLUCOSEU negative 06/15/2006 0000   HGBUR NEGATIVE 01/10/2020 1302   BILIRUBINUR NEGATIVE 01/10/2020 1302   KETONESUR NEGATIVE 01/10/2020 1302   PROTEINUR 100 (A) 01/10/2020 1302   UROBILINOGEN 0.2 09/06/2010 0430   NITRITE NEGATIVE 01/10/2020 1302   LEUKOCYTESUR NEGATIVE 01/10/2020 1302   Sepsis Labs: @LABRCNTIP (procalcitonin:4,lacticidven:4) )No results found for this or any previous visit (from the past 240 hour(s)).   Radiological Exams on Admission: CT Head Wo Contrast  Result Date: 01/10/2020 CLINICAL DATA:  Headache and dizziness. EXAM: CT HEAD WITHOUT CONTRAST TECHNIQUE: Contiguous axial images were obtained from the base of the skull through the vertex without intravenous contrast. COMPARISON:  None. FINDINGS: Brain: Ventricles and sulci are appropriate for patient's age. No evidence for acute cortically based infarct, intracranial hemorrhage, mass lesion or mass-effect. Vascular: Unremarkable Skull: Intact. Sinuses/Orbits: Mild mucosal thickening left maxillary sinus. Mastoid air  cells are unremarkable. Orbits are unremarkable. Other: None. IMPRESSION: No acute intracranial process. Electronically Signed   By: Annia Belt M.D.   On: 01/10/2020 13:55    EKG: Independently reviewed.  Sinus rhythm with PVCs.  Possible LVH.  No acute ST changes  Assessment/Plan: Active Problems:   Hypertensive urgency    This patient was discussed with the ED physician, including pertinent vitals, physical exam findings, labs, and imaging.  We also discussed care given by the ED provider.  1. Hypertensive urgency a. His acute high blood pressure has been corrected with labetalol.  No kidney damage seen on CMP.  Additionally, there is no neurological symptoms evident  on my exam. b. We will discharge the patient to home.  I instructed the patient to take his lisinopril/hydrochlorothiazide amlodipine when he picks up his medications from the pharmacy.  He does have a friend who will help him pay for the medications.  I called and spoke with the pharmacy staff member, who relayed that the total cost would be approximately $25 for his high blood pressure medications.  I also gave the patient a written prescription of his medications to take to [facility] should the medications be too expensive at [facility] pharmacy. c. I also instructed the patient to take a baby aspirin every day d. I reviewed stroke symptoms with the patient, including vertigo, unilateral weakness, severe headache, blurred vision, dysarthria, etc.  Patient to  return with any of these symptoms.   Levie Heritage, DO

## 2020-01-10 NOTE — ED Triage Notes (Signed)
Patient is being discharged from the Urgent Care and sent to the Emergency Department via private vehicle and RN volunteer  . Per Roderic Ovens , patient is in need of higher level of care due to head ache and blurred vision and increased BP . 204/113 Patient is aware and verbalizes understanding of plan of care. There were no vitals filed for this visit.

## 2020-01-10 NOTE — Discharge Instructions (Signed)
   Managing Your Hypertension Hypertension is commonly called high blood pressure. This is when the force of your blood pressing against the walls of your arteries is too strong. Arteries are blood vessels that carry blood from your heart throughout your body. Hypertension forces the heart to work harder to pump blood, and may cause the arteries to become narrow or stiff. Having untreated or uncontrolled hypertension can cause heart attack, stroke, kidney disease, and other problems. What are blood pressure readings? A blood pressure reading consists of a higher number over a lower number. Ideally, your blood pressure should be below 120/80. The first ("top") number is called the systolic pressure. It is a measure of the pressure in your arteries as your heart beats. The second ("bottom") number is called the diastolic pressure. It is a measure of the pressure in your arteries as the heart relaxes. What does my blood pressure reading mean? Blood pressure is classified into four stages. Based on your blood pressure reading, your health care provider may use the following stages to determine what type of treatment you need, if any. Systolic pressure and diastolic pressure are measured in a unit called mm Hg. Normal  Systolic pressure: below 120.  Diastolic pressure: below 80. Elevated  Systolic pressure: 120-129.  Diastolic pressure: below 80. Hypertension stage 1  Systolic pressure: 130-139.  Diastolic pressure: 80-89. Hypertension stage 2  Systolic pressure: 140 or above.  Diastolic pressure: 90 or above. What health risks are associated with hypertension? Managing your hypertension is an important responsibility. Uncontrolled hypertension can lead to:  A heart attack.  A stroke.  A weakened blood vessel (aneurysm).  Heart failure.  Kidney damage.  Eye damage.  Metabolic syndrome.  Memory and concentration problems. What changes can I make to manage my  hypertension? Hypertension can be managed by making lifestyle changes and possibly by taking medicines. Your health care provider will help you make a plan to bring your blood pressure within a normal range. Eating and drinking   Eat a diet that is high in fiber and potassium, and low in salt (sodium), added sugar, and fat. An example eating plan is called the DASH (Dietary Approaches to Stop Hypertension) diet. To eat this way: ? Eat plenty of fresh fruits and vegetables. Try to fill half of your plate at each meal with fruits and vegetables. ? Eat whole grains, such as whole wheat pasta, brown rice, or whole grain bread. Fill about one quarter of your plate with whole grains. ? Eat low-fat diary products. ? Avoid fatty cuts of meat, processed or cured meats, and poultry with skin. Fill about one quarter of your plate with lean proteins such as fish, chicken without skin, beans, eggs, and tofu. ? Avoid premade and processed foods. These tend to be higher in sodium, added sugar, and fat.  Reduce your daily sodium intake. Most people with hypertension should eat less than 1,500 mg of sodium a day.  Limit alcohol intake to no more than 1 drink a day for nonpregnant women and 2 drinks a day for men. One drink equals 12 oz of beer, 5 oz of wine, or 1 oz of hard liquor. Lifestyle  Work with your health care provider to maintain a healthy body weight, or to lose weight. Ask what an ideal weight is for you.  Get at least 30 minutes of exercise that causes your heart to beat faster (aerobic exercise) most days of the week. Activities may include walking, swimming, or biking.    Include exercise to strengthen your muscles (resistance exercise), such as weight lifting, as part of your weekly exercise routine. Try to do these types of exercises for 30 minutes at least 3 days a week.  Do not use any products that contain nicotine or tobacco, such as cigarettes and e-cigarettes. If you need help quitting,  ask your health care provider.  Control any long-term (chronic) conditions you have, such as high cholesterol or diabetes. Monitoring  Monitor your blood pressure at home as told by your health care provider. Your personal target blood pressure may vary depending on your medical conditions, your age, and other factors.  Have your blood pressure checked regularly, as often as told by your health care provider. Working with your health care provider  Review all the medicines you take with your health care provider because there may be side effects or interactions.  Talk with your health care provider about your diet, exercise habits, and other lifestyle factors that may be contributing to hypertension.  Visit your health care provider regularly. Your health care provider can help you create and adjust your plan for managing hypertension. Will I need medicine to control my blood pressure? Your health care provider may prescribe medicine if lifestyle changes are not enough to get your blood pressure under control, and if:  Your systolic blood pressure is 130 or higher.  Your diastolic blood pressure is 80 or higher. Take medicines only as told by your health care provider. Follow the directions carefully. Blood pressure medicines must be taken as prescribed. The medicine does not work as well when you skip doses. Skipping doses also puts you at risk for problems. Contact a health care provider if:  You think you are having a reaction to medicines you have taken.  You have repeated (recurrent) headaches.  You feel dizzy.  You have swelling in your ankles.  You have trouble with your vision. Get help right away if:  You develop a severe headache or confusion.  You have unusual weakness or numbness, or you feel faint.  You have severe pain in your chest or abdomen.  You vomit repeatedly.  You have trouble breathing. Summary  Hypertension is when the force of blood pumping  through your arteries is too strong. If this condition is not controlled, it may put you at risk for serious complications.  Your personal target blood pressure may vary depending on your medical conditions, your age, and other factors. For most people, a normal blood pressure is less than 120/80.  Hypertension is managed by lifestyle changes, medicines, or both. Lifestyle changes include weight loss, eating a healthy, low-sodium diet, exercising more, and limiting alcohol. This information is not intended to replace advice given to you by your health care provider. Make sure you discuss any questions you have with your health care provider. Document Revised: 11/15/2018 Document Reviewed: 06/21/2016 Elsevier Patient Education  2020 [employer]  

## 2020-01-10 NOTE — ED Provider Notes (Signed)
Emergency Department Provider Note   I have reviewed the triage vital signs and the nursing notes.   HISTORY  Chief Complaint Hypertension   HPI Antonio Small is a 59 y.o. male with PMH of HLD, HTN, and tobacco use presents to the emergency department from urgent care with elevated blood pressure, dizziness, and blurry vision.  Patient has been off of his blood pressure medications for the past 8 months.  He tells me that his headache and blurry vision have been present since approximately 1995.  He notes that the symptoms were worse today and he developed some lightheadedness and instability with walking which was new.  He was at the soup kitchen and saw someone with a medical name badge.  He asked him to take his blood pressure which was high and he was initially referred to urgent care but then quickly redirected to the emergency department for further evaluation.  He states that since cooling off his symptoms of headache and blurry vision have improved.  With walking, he does feel lightheaded and like he is still somewhat off balance.  He denies any chest pain, palpitations, shortness of breath.  No unilateral weakness or numbness.  No voice changes or difficulty swallowing.  Past Medical History:  Diagnosis Date   Hypertension     Patient Active Problem List   Diagnosis Date Noted   Hypertensive urgency 01/10/2020   TOBACCO ABUSE 10/12/2006   HYPERLIPIDEMIA 09/03/2006   HYPERTENSION 09/03/2006   LOW BACK PAIN 09/03/2006    Past Surgical History:  Procedure Laterality Date   BACK SURGERY     HERNIA REPAIR      Allergies Patient has no known allergies.  History reviewed. No pertinent family history.  Social History Social History   Tobacco Use   Smoking status: Current Every Day Smoker    Packs/day: 0.25    Types: Cigarettes   Smokeless tobacco: Never Used  Substance Use Topics   Alcohol use: Yes    Comment: occassionally   Drug use: No     Review of Systems  Constitutional: No fever/chills Eyes: Positive visual changes. ENT: No sore throat. Cardiovascular: Denies chest pain. Positive elevated BP.  Respiratory: Denies shortness of breath. Gastrointestinal: No abdominal pain.  No nausea, no vomiting.  No diarrhea.  No constipation. Genitourinary: Negative for dysuria. Musculoskeletal: Negative for back pain. Skin: Negative for rash. Neurological: Negative for focal weakness or numbness. Positive HA and gait instability.   10-point ROS otherwise negative.  ____________________________________________   PHYSICAL EXAM:  VITAL SIGNS: ED Triage Vitals  Enc Vitals Group     BP 01/10/20 1247 (!) 220/120     Pulse Rate 01/10/20 1247 84     Resp 01/10/20 1247 20     Temp 01/10/20 1247 98.8 F (37.1 C)     Temp Source 01/10/20 1247 Oral     SpO2 01/10/20 1247 97 %     Weight 01/10/20 1246 195 lb (88.5 kg)     Height 01/10/20 1242 5\' 9"  (1.753 m)   Constitutional: Alert and oriented. Well appearing and in no acute distress. Eyes: Conjunctivae are normal. PERRL. EOMI. Head: Atraumatic. Nose: No congestion/rhinnorhea. Mouth/Throat: Mucous membranes are moist. Neck: No stridor.   Cardiovascular: Normal rate, regular rhythm. Good peripheral circulation. Grossly normal heart sounds.   Respiratory: Normal respiratory effort.  No retractions. Lungs CTAB. Gastrointestinal: Soft and nontender. No distention.  Musculoskeletal: No lower extremity tenderness nor edema. No gross deformities of extremities. Neurologic:  Normal speech  and language.  No facial asymmetry.  Equal strength and sensation in the bilateral upper and lower extremities.  No pronator drift.  Normal sensation to the face.  Patient ambulatory with a slightly wide-based gait and shuffling with turning. No leaning.  Skin:  Skin is warm, dry and intact. No rash noted.  ____________________________________________   LABS (all labs ordered are listed, but  only abnormal results are displayed)  Labs Reviewed  COMPREHENSIVE METABOLIC PANEL - Abnormal; Notable for the following components:      Result Value   Potassium 3.2 (*)    Glucose, Bld 110 (*)    All other components within normal limits  URINALYSIS, ROUTINE W REFLEX MICROSCOPIC - Abnormal; Notable for the following components:   Protein, ur 100 (*)    All other components within normal limits  SARS CORONAVIRUS 2 BY RT PCR (HOSPITAL ORDER, PERFORMED IN [facility])  CBC WITH DIFFERENTIAL/PLATELET  ETHANOL  TROPONIN I (HIGH SENSITIVITY)  TROPONIN I (HIGH SENSITIVITY)   ____________________________________________  EKG   EKG Interpretation  Date/Time:  Saturday January 10 2020 12:40:09 EDT Ventricular Rate:  82 PR Interval:    QRS Duration: 94 QT Interval:  386 QTC Calculation: 451 R Axis:   80 Text Interpretation: Sinus rhythm Ventricular premature complex Aberrant conduction of SV complex(es) Left atrial enlargement RSR' in V1 or V2, probably normal variant No STEMI Confirmed by Alona Bene [phone]) on 01/10/2020 1:27:34 PM       ____________________________________________  RADIOLOGY  CT Head Wo Contrast  Result Date: 01/10/2020 CLINICAL DATA:  Headache and dizziness. EXAM: CT HEAD WITHOUT CONTRAST TECHNIQUE: Contiguous axial images were obtained from the base of the skull through the vertex without intravenous contrast. COMPARISON:  None. FINDINGS: Brain: Ventricles and sulci are appropriate for patient's age. No evidence for acute cortically based infarct, intracranial hemorrhage, mass lesion or mass-effect. Vascular: Unremarkable Skull: Intact. Sinuses/Orbits: Mild mucosal thickening left maxillary sinus. Mastoid air cells are unremarkable. Orbits are unremarkable. Other: None. IMPRESSION: No acute intracranial process. Electronically Signed   By: Annia Belt M.D.   On: 01/10/2020 13:55     ____________________________________________   PROCEDURES  Procedure(s) performed:   Procedures  None  ____________________________________________   INITIAL IMPRESSION / ASSESSMENT AND PLAN / ED COURSE  Pertinent labs & imaging results that were available during my care of the patient were reviewed by me and considered in my medical decision making (see chart for details).   Patient presents to the emergency department for evaluation of elevated blood pressure with some gait instability.  He had blurry vision and headache earlier which have since resolved and seem more chronic.  The gait instability is new according to the patient.  He does have some wide-based gait on exam and feeling lightheaded with standing.  Question hypertensive emergency versus acute stroke.  Lower suspicion for bleed clinically.  Will send for initial CT imaging of the head.  Blood pressure is severely elevated here even when allowing for permissive hypertension.  Given that it is 220/120 I plan for single dose labetalol to bring it down slightly but will not aggressively control this as CVA is on the differential.   CT head and labs reviewed.  Symptoms have improved significantly including gait after blood pressure management.  Feel the patient would benefit from admission for hypertensive emergency and MRI. Discussed with Dr. Adrian Blackwater who will see the patient in the ED.  ____________________________________________  FINAL CLINICAL IMPRESSION(S) / ED DIAGNOSES  Final diagnoses:  Hypertensive  urgency    MEDICATIONS GIVEN DURING THIS VISIT:  Medications  labetalol (NORMODYNE) injection 5 mg (5 mg Intravenous Given 01/10/20 1317)    Note:  This document was prepared using Dragon voice recognition software and may include unintentional dictation errors.  Nanda Quinton, MD, [facility] Emergency Medicine    Lashaye Fisk, [facility], MD 01/11/20 [phone]

## 2020-01-10 NOTE — ED Triage Notes (Signed)
PT was seen at urgent care today briefly for hypertension, headache, dizziness at urgent care today and told to come to the ED for further evaluation. PT states he ran out his HTN medications around 8 months ago after he got out of jail and doesn't have primary care at the moment.

## 2020-01-10 NOTE — ED Triage Notes (Signed)
Pt brought in from nurse volunteering at soup kitchen. Pts BP was checked there and found to be 199/125. Pt has c/o headache and blurred vision for past 7 months and has been out of medication for same amount of time. Pt refused to go to ED

## 2020-01-26 ENCOUNTER — Encounter (HOSPITAL_COMMUNITY): Payer: Self-pay | Admitting: *Deleted

## 2020-01-26 ENCOUNTER — Other Ambulatory Visit: Payer: Self-pay

## 2020-01-26 ENCOUNTER — Emergency Department (HOSPITAL_COMMUNITY): Payer: Medicaid Other

## 2020-01-26 ENCOUNTER — Emergency Department (HOSPITAL_COMMUNITY)
Admission: EM | Admit: 2020-01-26 | Discharge: 2020-01-26 | Disposition: A | Discharge status: Home / Self Care | Payer: Medicaid Other | Attending: Emergency Medicine | Admitting: Emergency Medicine

## 2020-01-26 DIAGNOSIS — F17210 Nicotine dependence, cigarettes, uncomplicated: Secondary | ICD-10-CM | POA: Insufficient documentation

## 2020-01-26 DIAGNOSIS — I10 Essential (primary) hypertension: Secondary | ICD-10-CM | POA: Diagnosis not present

## 2020-01-26 DIAGNOSIS — R1031 Right lower quadrant pain: Secondary | ICD-10-CM | POA: Diagnosis not present

## 2020-01-26 DIAGNOSIS — R1030 Lower abdominal pain, unspecified: Secondary | ICD-10-CM

## 2020-01-26 LAB — CBC WITH DIFFERENTIAL/PLATELET
Abs Immature Granulocytes: 0.01 10*3/uL (ref 0.00–0.07)
Basophils Absolute: 0 10*3/uL (ref 0.0–0.1)
Basophils Relative: 0 %
Eosinophils Absolute: 0.1 10*3/uL (ref 0.0–0.5)
Eosinophils Relative: 1 %
HCT: 49.2 % (ref 39.0–52.0)
Hemoglobin: 16.2 g/dL (ref 13.0–17.0)
Immature Granulocytes: 0 %
Lymphocytes Relative: 34 %
Lymphs Abs: 1.5 10*3/uL (ref 0.7–4.0)
MCH: 31.8 pg (ref 26.0–34.0)
MCHC: 32.9 g/dL (ref 30.0–36.0)
MCV: 96.7 fL (ref 80.0–100.0)
Monocytes Absolute: 0.6 10*3/uL (ref 0.1–1.0)
Monocytes Relative: 14 %
Neutro Abs: 2.3 10*3/uL (ref 1.7–7.7)
Neutrophils Relative %: 51 %
Platelets: 290 10*3/uL (ref 150–400)
RBC: 5.09 MIL/uL (ref 4.22–5.81)
RDW: 12.4 % (ref 11.5–15.5)
WBC: 4.5 10*3/uL (ref 4.0–10.5)
nRBC: 0 % (ref 0.0–0.2)

## 2020-01-26 LAB — BASIC METABOLIC PANEL
Anion gap: 9 (ref 5–15)
BUN: 16 mg/dL (ref 6–20)
CO2: 31 mmol/L (ref 22–32)
Calcium: 9.6 mg/dL (ref 8.9–10.3)
Chloride: 98 mmol/L (ref 98–111)
Creatinine, Ser: 1.16 mg/dL (ref 0.61–1.24)
GFR calc Af Amer: >60 mL/min (ref >60)
GFR calc non Af Amer: >60 mL/min (ref >60)
Glucose, Bld: 87 mg/dL (ref 70–99)
Potassium: 3.7 mmol/L (ref 3.5–5.1)
Sodium: 138 mmol/L (ref 135–145)

## 2020-01-26 MED ORDER — IOHEXOL 300 MG/ML  SOLN
100.0000 mL | Freq: Once | INTRAMUSCULAR | Status: AC | PRN
  Administered 2020-01-26: 100 mL via INTRAVENOUS

## 2020-01-26 MED ORDER — IBUPROFEN 800 MG PO TABS: 800.0000 mg | ORAL_TABLET | Freq: Three times a day (TID) | ORAL | 0 refills | Status: DC | PRN

## 2020-01-26 MED ORDER — DICYCLOMINE HCL 20 MG PO TABS
20.0000 mg | ORAL_TABLET | Freq: Three times a day (TID) | ORAL | 0 refills | Status: DC | PRN
Start: 2020-01-26 — End: 2021-02-15

## 2020-01-26 NOTE — ED Triage Notes (Signed)
Pain in right lower quadrant, states he has a hernia onset 4 days ago

## 2020-01-26 NOTE — Discharge Instructions (Signed)
You were seen in the emergency department today with lower abdominal pain.  You are having some pain near your operative site where they repaired your hernias on each side.  There is no active hernia but if you are having pain it may be worthwhile to follow with the general surgeon.  They are seeing something that looks like possible scar tissue near the area of repair.  If your pain worsens or you develop fever, vomiting, other severe symptoms you should return to the emergency department for evaluation.  Otherwise, please follow with your primary care doctor and the general surgeon listed.  You will need to call for an appointment.

## 2020-01-26 NOTE — ED Provider Notes (Signed)
Emergency Department Provider Note   I have reviewed the triage vital signs and the nursing notes.   HISTORY  Chief Complaint Abdominal Pain   HPI Antonio Small is a 59 y.o. male with past surgical history of bilateral inguinal hernia repair in the right 80s presents to the ED with worsening pain over the last 4 days. He was lifting some heavy furniture when symptoms began. He initially had discomfort on the left and then the right. He reports some bulging. No testicle pain or scrotal swelling or masses. Swelling has gone down. He no longer follows with a general surgeon as the surgery was many years ago. He is not having vomiting and is having BMs. Pain is worse with touching and moving.     Past Medical History:  Diagnosis Date  . Hypertension     Patient Active Problem List   Diagnosis Date Noted  . Hypertensive urgency 01/10/2020  . TOBACCO ABUSE 10/12/2006  . HYPERLIPIDEMIA 09/03/2006  . HYPERTENSION 09/03/2006  . LOW BACK PAIN 09/03/2006    Past Surgical History:  Procedure Laterality Date  . BACK SURGERY    . HERNIA REPAIR      Allergies Patient has no known allergies.  No family history on file.  Social History Social History   Tobacco Use  . Smoking status: Current Every Day Smoker    Packs/day: 0.25    Types: Cigarettes  . Smokeless tobacco: Never Used  Vaping Use  . Vaping Use: Never used  Substance Use Topics  . Alcohol use: Yes    Comment: occassionally  . Drug use: No    Review of Systems  Constitutional: No fever/chills Eyes: No visual changes. ENT: No sore throat. Cardiovascular: Denies chest pain. Respiratory: Denies shortness of breath. Gastrointestinal: Positive lower abdominal pain.  No nausea, no vomiting.  No diarrhea.  No constipation. Genitourinary: Negative for dysuria. Musculoskeletal: Negative for back pain. Skin: Negative for rash. Neurological: Negative for headaches, focal weakness or numbness.  10-point ROS  otherwise negative.  ____________________________________________   PHYSICAL EXAM:  VITAL SIGNS: ED Triage Vitals  Enc Vitals Group     BP 01/26/20 1117 (!) 138/100     Pulse Rate 01/26/20 1117 88     Resp 01/26/20 1117 20     Temp 01/26/20 1117 98.3 F (36.8 C)     Temp src --      SpO2 01/26/20 1117 97 %     Weight 01/26/20 1120 185 lb (83.9 kg)     Height 01/26/20 1120 5\' 9"  (1.753 m)   Constitutional: Alert and oriented. Well appearing and in no acute distress. Eyes: Conjunctivae are normal.  Head: Atraumatic. Nose: No congestion/rhinnorhea. Mouth/Throat: Mucous membranes are moist.  Neck: No stridor.   Cardiovascular: Normal rate, regular rhythm. Good peripheral circulation. Grossly normal heart sounds.   Respiratory: Normal respiratory effort.  No retractions. Lungs CTAB. Gastrointestinal: Soft and nontender. No palpable inguinal hernia direct or indirect. No distention.  Genitourinary: Exam performed with patient's verbal consent and chaperone. No testicular swelling or tenderness.  Musculoskeletal: No lower extremity tenderness nor edema. No gross deformities of extremities. Neurologic:  Normal speech and language. No gross focal neurologic deficits are appreciated.  Skin:  Skin is warm, dry and intact. No rash noted.  ____________________________________________   LABS (all labs ordered are listed, but only abnormal results are displayed)  Labs Reviewed  BASIC METABOLIC PANEL  CBC WITH DIFFERENTIAL/PLATELET   ____________________________________________  RADIOLOGY  CT ABDOMEN PELVIS W CONTRAST  Result Date: 01/26/2020 CLINICAL DATA:  Right lower quadrant pain for 4 days, history of hernia repair EXAM: CT ABDOMEN AND PELVIS WITH CONTRAST TECHNIQUE: Multidetector CT imaging of the abdomen and pelvis was performed using the standard protocol following bolus administration of intravenous contrast. CONTRAST:  18mL OMNIPAQUE IOHEXOL 300 MG/ML  SOLN COMPARISON:   09/06/2010 FINDINGS: Lower chest: No acute abnormality. Hepatobiliary: No solid liver abnormality is seen. No gallstones, gallbladder wall thickening, or biliary dilatation. Pancreas: Unremarkable. No pancreatic ductal dilatation or surrounding inflammatory changes. Spleen: Normal in size without significant abnormality. Adrenals/Urinary Tract: Adrenal glands are unremarkable. Multiple small bilateral nonobstructive renal calculi. No hydronephrosis. Bladder is unremarkable. Stomach/Bowel: Stomach is within normal limits. Appendix appears normal. Sigmoid diverticulosis. Vascular/Lymphatic: Aortic atherosclerosis. No enlarged abdominal or pelvic lymph nodes. Reproductive: No mass or other significant abnormality. Other: Postoperative findings of bilateral inguinal hernia repair. There is soft tissue in the vicinity of the left inguinal canal measuring approximately 3.4 x 1.9 cm, new compared to prior examination (series 2, image 74). No abdominopelvic ascites. Musculoskeletal: No acute or significant osseous findings. IMPRESSION: 1. No acute CT findings of the abdomen or pelvis to explain right lower quadrant abdominal pain. Normal appendix. 2. Postoperative findings of bilateral inguinal hernia repair. There is soft tissue in the vicinity of the left inguinal canal measuring approximately 3.4 x 1.9 cm, new compared to prior examination. This is of uncertain nature, possibly postoperative scar tissue. Correlate with clinical examination. Targeted ultrasound may be helpful to further evaluate. 3. Nonobstructive bilateral nephrolithiasis.  No hydronephrosis. 4. Sigmoid diverticulosis without evidence of acute diverticulitis. 5. Aortic Atherosclerosis (ICD10-I70.0). Electronically Signed   By: Eddie Candle M.D.   On: 01/26/2020 15:22    ____________________________________________   PROCEDURES  Procedure(s) performed:   Procedures  None ____________________________________________   INITIAL IMPRESSION /  ASSESSMENT AND PLAN / ED COURSE  Pertinent labs & imaging results that were available during my care of the patient were reviewed by me and considered in my medical decision making (see chart for details).   Patient with pain in the inguinal areas bilaterally. No palpable masses. No testicular tenderness. No clinical concern for torsion. CT with likely surgical scar tissue but no hernia. Plan for pain mgmt at home and surgery f/u if symptoms worsen. Discussed ED return precautions.    ____________________________________________  FINAL CLINICAL IMPRESSION(S) / ED DIAGNOSES  Final diagnoses:  Lower abdominal pain     MEDICATIONS GIVEN DURING THIS VISIT:  Medications  iohexol (OMNIPAQUE) 300 MG/ML solution 100 mL (100 mLs Intravenous Contrast Given 01/26/20 1438)     NEW OUTPATIENT MEDICATIONS STARTED DURING THIS VISIT:  Discharge Medication List as of 01/26/2020  3:29 PM    START taking these medications   Details  dicyclomine (BENTYL) 20 MG tablet Take 1 tablet (20 mg total) by mouth 3 (three) times daily as needed for spasms (abdominal pain)., Starting Mon 01/26/2020, Normal    ibuprofen (ADVIL) 800 MG tablet Take 1 tablet (800 mg total) by mouth every 8 (eight) hours as needed., Starting Mon 01/26/2020, Normal        Note:  This document was prepared using Dragon voice recognition software and may include unintentional dictation errors.  Nanda Quinton, MD, [facility] Emergency Medicine    Paulyne Mooty, [facility], MD 01/27/20 [phone]

## 2020-03-29 ENCOUNTER — Ambulatory Visit (HOSPITAL_COMMUNITY)
Admission: EM | Admit: 2020-03-29 | Discharge: 2020-03-29 | Disposition: A | Discharge status: Home / Self Care | Payer: Medicaid Other | Attending: Urgent Care | Admitting: Urgent Care

## 2020-03-29 ENCOUNTER — Other Ambulatory Visit: Payer: Self-pay

## 2020-03-29 ENCOUNTER — Encounter (HOSPITAL_COMMUNITY): Payer: Self-pay

## 2020-03-29 DIAGNOSIS — Z8719 Personal history of other diseases of the digestive system: Secondary | ICD-10-CM

## 2020-03-29 DIAGNOSIS — R102 Pelvic and perineal pain: Secondary | ICD-10-CM

## 2020-03-29 DIAGNOSIS — R1909 Other intra-abdominal and pelvic swelling, mass and lump: Secondary | ICD-10-CM

## 2020-03-29 MED ORDER — HYDROCODONE-ACETAMINOPHEN 5-325 MG PO TABS: 1.0000 | ORAL_TABLET | Freq: Four times a day (QID) | ORAL | 0 refills | Status: DC | PRN

## 2020-03-29 MED ORDER — HYDROCODONE-ACETAMINOPHEN 5-325 MG PO TABS
ORAL_TABLET | ORAL | Status: AC
  Filled 2020-03-29: qty 1

## 2020-03-29 MED ORDER — HYDROCODONE-ACETAMINOPHEN 5-325 MG PO TABS: 1.0000 | ORAL_TABLET | Freq: Once | ORAL | Status: DC

## 2020-03-29 NOTE — ED Triage Notes (Signed)
Pt c/o right groin pain x 1 month, states worse with coughing. Was seen at [facility] approx 1 month ago, states was told it was a pulled muscle, but is not better.

## 2020-03-29 NOTE — Discharge Instructions (Signed)
Unfortunately I was not able to get in touch with [facility] prior to your need to leave the clinic.  I highly recommend that you give them a call and set up an appointment for an urgent consult regarding your right groin mass.  In the meantime you can schedule Tylenol at a dose of 500 mg to 650 mg once every 6 hours as needed for your pain.  If you continue to have pain despite taking Tylenol, this is breakthrough pain.  You can use hydrocodone for this.  Once your pain is better controlled then go back to just Tylenol.

## 2020-03-29 NOTE — ED Provider Notes (Signed)
[facility]   MRN: [id number] DOB: [birth year]  Subjective:   Antonio Small is a 59 y.o. male presenting for 1 month hx of acute onset right sided groin pain and mass. Sx worse with coughing, bearing down and touch. Has a remote hx of bilateral inguinal hernia repair. Went to [facility] ~1 month ago, had CT scan done (results below), negative bloodwork.   No current facility-administered medications for this encounter.  Current Outpatient Medications:  .  amLODipine (NORVASC) 5 MG tablet, Take 1 tablet (5 mg total) by mouth every morning., Disp: 30 tablet, Rfl: 0 .  aspirin EC 81 MG tablet, Take 81 mg by mouth every morning., Disp: , Rfl:  .  dicyclomine (BENTYL) 20 MG tablet, Take 1 tablet (20 mg total) by mouth 3 (three) times daily as needed for spasms (abdominal pain)., Disp: 20 tablet, Rfl: 0 .  ibuprofen (ADVIL) 800 MG tablet, Take 1 tablet (800 mg total) by mouth every 8 (eight) hours as needed., Disp: 21 tablet, Rfl: 0 .  lisinopril-hydrochlorothiazide (ZESTORETIC) 20-25 MG tablet, Take 1 tablet by mouth daily., Disp: 30 tablet, Rfl: 0 .  pravastatin (PRAVACHOL) 40 MG tablet, Take 1 tablet (40 mg total) by mouth every morning., Disp: 30 tablet, Rfl: 0   No Known Allergies  Past Medical History:  Diagnosis Date  . Hypertension      Past Surgical History:  Procedure Laterality Date  . BACK SURGERY    . HERNIA REPAIR      History reviewed. No pertinent family history.  Social History   Tobacco Use  . Smoking status: Current Every Day Smoker    Packs/day: 0.25    Types: Cigarettes  . Smokeless tobacco: Never Used  Vaping Use  . Vaping Use: Never used  Substance Use Topics  . Alcohol use: Yes    Comment: occassionally  . Drug use: No    ROS   Objective:   Vitals: BP (!) 152/77   Pulse 90   Temp 98.2 F (36.8 C)   Resp 16   SpO2 99%   Physical Exam Constitutional:      General: He is not in acute distress.    Appearance: Normal  appearance. He is well-developed. He is not ill-appearing, toxic-appearing or diaphoretic.  HENT:     Head: Normocephalic and atraumatic.     Right Ear: External ear normal.     Left Ear: External ear normal.     Nose: Nose normal.     Mouth/Throat:     Mouth: Mucous membranes are moist.     Pharynx: Oropharynx is clear.  Eyes:     General: No scleral icterus.    Extraocular Movements: Extraocular movements intact.     Pupils: Pupils are equal, round, and reactive to light.  Cardiovascular:     Rate and Rhythm: Normal rate and regular rhythm.     Heart sounds: Normal heart sounds. No murmur heard.  No friction rub. No gallop.   Pulmonary:     Effort: Pulmonary effort is normal. No respiratory distress.     Breath sounds: Normal breath sounds. No stridor. No wheezing, rhonchi or rales.  Abdominal:     General: Bowel sounds are normal. There is no distension.     Palpations: Abdomen is soft. There is no mass.     Tenderness: There is no abdominal tenderness. There is no guarding or rebound.    Skin:    General: Skin is warm and dry.  Neurological:  Mental Status: He is alert and oriented to person, place, and time.  Psychiatric:        Mood and Affect: Mood normal.        Behavior: Behavior normal.        Thought Content: Thought content normal.     Recent Results (from the past 2160 hour(s))  Urinalysis, Routine w reflex microscopic     Status: Abnormal   Collection Time: 01/10/20  1:02 PM  Result Value Ref Range   Color, Urine YELLOW YELLOW   APPearance CLEAR CLEAR   Specific Gravity, Urine 1.017 1.005 - 1.030   pH 6.0 5.0 - 8.0   Glucose, UA NEGATIVE NEGATIVE mg/dL   Hgb urine dipstick NEGATIVE NEGATIVE   Bilirubin Urine NEGATIVE NEGATIVE   Ketones, ur NEGATIVE NEGATIVE mg/dL   Protein, ur 409 (A) NEGATIVE mg/dL   Nitrite NEGATIVE NEGATIVE   Leukocytes,Ua NEGATIVE NEGATIVE   RBC / HPF 6-10 0 - 5 RBC/hpf   WBC, UA 0-5 0 - 5 WBC/hpf   Bacteria, UA NONE SEEN  NONE SEEN   Mucus PRESENT    Ca Oxalate Crys, UA PRESENT     Comment: Performed at [facility], [address], [place], [place] [address]  Comprehensive metabolic panel     Status: Abnormal   Collection Time: 01/10/20  1:10 PM  Result Value Ref Range   Sodium 140 135 - 145 mmol/L   Potassium 3.2 (L) 3.5 - 5.1 mmol/L   Chloride 103 98 - 111 mmol/L   CO2 28 22 - 32 mmol/L   Glucose, Bld 110 (H) 70 - 99 mg/dL    Comment: Glucose reference range applies only to samples taken after fasting for at least 8 hours.   BUN 15 6 - 20 mg/dL   Creatinine, Ser 4.78 0.61 - 1.24 mg/dL   Calcium 9.0 8.9 - 29.5 mg/dL   Total Protein 7.2 6.5 - 8.1 g/dL   Albumin 3.6 3.5 - 5.0 g/dL   AST 22 15 - 41 U/L   ALT 18 0 - 44 U/L   Alkaline Phosphatase 97 38 - 126 U/L   Total Bilirubin 0.8 0.3 - 1.2 mg/dL   GFR calc non Af Amer >60 >60 mL/min   GFR calc Af Amer >60 >60 mL/min   Anion gap 9 5 - 15    Comment: Performed at [facility], [address] [place], [place] [address]  Troponin I (High Sensitivity)     Status: None   Collection Time: 01/10/20  1:10 PM  Result Value Ref Range   Troponin I (High Sensitivity) 4 <18 ng/L    Comment: (NOTE) Elevated high sensitivity troponin I (hsTnI) values and significant  changes across serial measurements may suggest ACS but many other  chronic and acute conditions are known to elevate hsTnI results.  Refer to the "Links" section for chest pain algorithms and additional  guidance. Performed at [facility], [address], [place], [place] [address]   CBC with Differential     Status: None   Collection Time: 01/10/20  1:10 PM  Result Value Ref Range   WBC 4.8 4.0 - 10.5 K/uL   RBC 4.81 4.22 - 5.81 MIL/uL   Hemoglobin 15.3 13.0 - 17.0 g/dL   HCT 46.9 39 - 52 %   MCV 97.3 80.0 - 100.0 fL   MCH 31.8 26.0 - 34.0 pg   MCHC 32.7 30.0 - 36.0 g/dL   RDW 62.9 52.8 - 41.3 %   Platelets  261 150 - 400 K/uL   nRBC 0.0 0.0 - 0.2 %   Neutrophils Relative % 49 %    Neutro Abs 2.4 1.7 - 7.7 K/uL   Lymphocytes Relative 32 %   Lymphs Abs 1.6 0.7 - 4.0 K/uL   Monocytes Relative 15 %   Monocytes Absolute 0.7 0 - 1 K/uL   Eosinophils Relative 3 %   Eosinophils Absolute 0.2 0 - 0 K/uL   Basophils Relative 1 %   Basophils Absolute 0.0 0 - 0 K/uL   Immature Granulocytes 0 %   Abs Immature Granulocytes 0.00 0.00 - 0.07 K/uL    Comment: Performed at [facility], [address], [place], [place] [address]  Ethanol     Status: None   Collection Time: 01/10/20  1:10 PM  Result Value Ref Range   Alcohol, Ethyl (B) <10 <10 mg/dL    Comment: (NOTE) Lowest detectable limit for serum alcohol is 10 mg/dL. For medical purposes only. Performed at [facility], [address], [place], [place] [address]   SARS Coronavirus 2 by RT PCR (hospital order, performed in [facility] hospital lab) Nasopharyngeal Nasopharyngeal Swab     Status: None   Collection Time: 01/10/20  3:06 PM   Specimen: Nasopharyngeal Swab  Result Value Ref Range   SARS Coronavirus 2 NEGATIVE NEGATIVE    Comment: (NOTE) SARS-CoV-2 target nucleic acids are NOT DETECTED. The SARS-CoV-2 RNA is generally detectable in upper and lower respiratory specimens during the acute phase of infection. The lowest concentration of SARS-CoV-2 viral copies this assay can detect is 250 copies / mL. A negative result does not preclude SARS-CoV-2 infection and should not be used as the sole basis for treatment or other patient management decisions.  A negative result may occur with improper specimen collection / handling, submission of specimen other than nasopharyngeal swab, presence of viral mutation(s) within the areas targeted by this assay, and inadequate number of viral copies (<250 copies / mL). A negative result must be combined with clinical observations, patient history, and epidemiological information. Fact Sheet for Patients:   BoilerBrush.com.cy Fact Sheet for Healthcare  Providers: https://pope.com/ This test is not yet approved or cleared  by the [place] FDA and has been authorized for detection and/or diagnosis of SARS-CoV-2 by FDA under an Emergency Use Authorization (EUA).  This EUA will remain in effect (meaning this test can be used) for the duration of the COVID-19 declaration under Section 564(b)(1) of the Act, 21 U.S.C. section 360bbb-3(b)(1), unless the authorization is terminated or revoked sooner. Performed at [facility], [address], [place], [place] [address]   Troponin I (High Sensitivity)     Status: None   Collection Time: 01/10/20  3:11 PM  Result Value Ref Range   Troponin I (High Sensitivity) 4 <18 ng/L    Comment: (NOTE) Elevated high sensitivity troponin I (hsTnI) values and significant  changes across serial measurements may suggest ACS but many other  chronic and acute conditions are known to elevate hsTnI results.  Refer to the "Links" section for chest pain algorithms and additional  guidance. Performed at [facility], [address] [place], [place] [address]   Basic metabolic panel     Status: None   Collection Time: 01/26/20  1:10 PM  Result Value Ref Range   Sodium 138 135 - 145 mmol/L   Potassium 3.7 3.5 - 5.1 mmol/L   Chloride 98 98 - 111 mmol/L   CO2 31 22 - 32 mmol/L  Glucose, Bld 87 70 - 99 mg/dL    Comment: Glucose reference range applies only to samples taken after fasting for at least 8 hours.   BUN 16 6 - 20 mg/dL   Creatinine, Ser 1.611.16 0.61 - 1.24 mg/dL   Calcium 9.6 8.9 - 09.610.3 mg/dL   GFR calc non Af Amer >60 >60 mL/min   GFR calc Af Amer >60 >60 mL/min   Anion gap 9 5 - 15    Comment: Performed at [facility], [address] [place], [place] [address]  CBC with Differential     Status: None   Collection Time: 01/26/20  1:10 PM  Result Value Ref Range   WBC 4.5 4.0 - 10.5 K/uL   RBC 5.09 4.22 - 5.81 MIL/uL   Hemoglobin 16.2 13.0 - 17.0 g/dL   HCT 98.149.2 39 - 52 %     MCV 96.7 80.0 - 100.0 fL   MCH 31.8 26.0 - 34.0 pg   MCHC 32.9 30.0 - 36.0 g/dL   RDW 19.112.4 47.811.5 - 29.515.5 %   Platelets 290 150 - 400 K/uL   nRBC 0.0 0.0 - 0.2 %   Neutrophils Relative % 51 %   Neutro Abs 2.3 1.7 - 7.7 K/uL   Lymphocytes Relative 34 %   Lymphs Abs 1.5 0.7 - 4.0 K/uL   Monocytes Relative 14 %   Monocytes Absolute 0.6 0 - 1 K/uL   Eosinophils Relative 1 %   Eosinophils Absolute 0.1 0 - 0 K/uL   Basophils Relative 0 %   Basophils Absolute 0.0 0 - 0 K/uL   Immature Granulocytes 0 %   Abs Immature Granulocytes 0.01 0.00 - 0.07 K/uL    Comment: Performed at [facility], [address] [place], [place] [address]    CT Head Wo Contrast  Result Date: 01/10/2020 CLINICAL DATA:  Headache and dizziness. EXAM: CT HEAD WITHOUT CONTRAST TECHNIQUE: Contiguous axial images were obtained from the base of the skull through the vertex without intravenous contrast. COMPARISON:  None. FINDINGS: Brain: Ventricles and sulci are appropriate for patient's age. No evidence for acute cortically based infarct, intracranial hemorrhage, mass lesion or mass-effect. Vascular: Unremarkable Skull: Intact. Sinuses/Orbits: Mild mucosal thickening left maxillary sinus. Mastoid air cells are unremarkable. Orbits are unremarkable. Other: None. IMPRESSION: No acute intracranial process. Electronically Signed   By: Annia Beltrew  Davis M.D.   On: 01/10/2020 13:55   CT ABDOMEN PELVIS W CONTRAST  Result Date: 01/26/2020 CLINICAL DATA:  Right lower quadrant pain for 4 days, history of hernia repair EXAM: CT ABDOMEN AND PELVIS WITH CONTRAST TECHNIQUE: Multidetector CT imaging of the abdomen and pelvis was performed using the standard protocol following bolus administration of intravenous contrast. CONTRAST:  100mL OMNIPAQUE IOHEXOL 300 MG/ML  SOLN COMPARISON:  09/06/2010 FINDINGS: Lower chest: No acute abnormality. Hepatobiliary: No solid liver abnormality is seen. No gallstones, gallbladder wall thickening, or biliary  dilatation. Pancreas: Unremarkable. No pancreatic ductal dilatation or surrounding inflammatory changes. Spleen: Normal in size without significant abnormality. Adrenals/Urinary Tract: Adrenal glands are unremarkable. Multiple small bilateral nonobstructive renal calculi. No hydronephrosis. Bladder is unremarkable. Stomach/Bowel: Stomach is within normal limits. Appendix appears normal. Sigmoid diverticulosis. Vascular/Lymphatic: Aortic atherosclerosis. No enlarged abdominal or pelvic lymph nodes. Reproductive: No mass or other significant abnormality. Other: Postoperative findings of bilateral inguinal hernia repair. There is soft tissue in the vicinity of the left inguinal canal measuring approximately 3.4 x 1.9 cm, new compared to prior examination (series 2, image 74). No abdominopelvic ascites. Musculoskeletal: No  acute or significant osseous findings. IMPRESSION: 1. No acute CT findings of the abdomen or pelvis to explain right lower quadrant abdominal pain. Normal appendix. 2. Postoperative findings of bilateral inguinal hernia repair. There is soft tissue in the vicinity of the left inguinal canal measuring approximately 3.4 x 1.9 cm, new compared to prior examination. This is of uncertain nature, possibly postoperative scar tissue. Correlate with clinical examination. Targeted ultrasound may be helpful to further evaluate. 3. Nonobstructive bilateral nephrolithiasis.  No hydronephrosis. 4. Sigmoid diverticulosis without evidence of acute diverticulitis. 5. Aortic Atherosclerosis (ICD10-I70.0). Electronically Signed   By: Lauralyn Primes M.D.   On: 01/26/2020 15:22   Patient given hydrocodone in clinic for his severe pain.   Assessment and Plan :   I have reviewed the PDMP during this encounter.  1. Pelvic pain   2. Right groin mass   3. History of inguinal hernia repair, bilateral     Patient was unable to wait for me to secure an appointment with [facility] surgery for an urgent consult  regarding his right groin mass.  Recommended management with scheduled Tylenol, hydrocodone for breakthrough pain.  Contact [facility] as soon as possible. Counseled patient on potential for adverse effects with medications prescribed/recommended today, ER and return-to-clinic precautions discussed, patient verbalized understanding.    Wallis Bamberg, [place] 03/29/20 1338

## 2020-04-21 ENCOUNTER — Other Ambulatory Visit: Payer: Self-pay | Admitting: Surgery

## 2020-05-06 ENCOUNTER — Encounter (HOSPITAL_BASED_OUTPATIENT_CLINIC_OR_DEPARTMENT_OTHER): Payer: Self-pay | Admitting: Surgery

## 2020-05-07 ENCOUNTER — Other Ambulatory Visit: Payer: Self-pay

## 2020-05-10 ENCOUNTER — Other Ambulatory Visit (HOSPITAL_COMMUNITY)
Admission: RE | Admit: 2020-05-10 | Discharge: 2020-05-10 | Disposition: A | Discharge status: Home / Self Care | Payer: Medicaid Other | Source: Ambulatory Visit | Attending: Surgery | Admitting: Surgery

## 2020-05-10 DIAGNOSIS — Z20822 Contact with and (suspected) exposure to covid-19: Secondary | ICD-10-CM | POA: Insufficient documentation

## 2020-05-10 DIAGNOSIS — Z01812 Encounter for preprocedural laboratory examination: Secondary | ICD-10-CM | POA: Diagnosis not present

## 2020-05-10 LAB — SARS CORONAVIRUS 2 (TAT 6-24 HRS): SARS Coronavirus 2: NEGATIVE

## 2020-05-12 ENCOUNTER — Encounter (HOSPITAL_BASED_OUTPATIENT_CLINIC_OR_DEPARTMENT_OTHER)
Admission: RE | Admit: 2020-05-12 | Discharge: 2020-05-12 | Disposition: A | Discharge status: Home / Self Care | Payer: Medicaid Other | Source: Ambulatory Visit | Attending: Surgery | Admitting: Surgery

## 2020-05-12 DIAGNOSIS — Z01812 Encounter for preprocedural laboratory examination: Secondary | ICD-10-CM | POA: Insufficient documentation

## 2020-05-12 LAB — BASIC METABOLIC PANEL
Anion gap: 9 (ref 5–15)
BUN: 14 mg/dL (ref 6–20)
CO2: 28 mmol/L (ref 22–32)
Calcium: 9.2 mg/dL (ref 8.9–10.3)
Chloride: 105 mmol/L (ref 98–111)
Creatinine, Ser: 1.1 mg/dL (ref 0.61–1.24)
GFR calc non Af Amer: >60 mL/min (ref >60)
Glucose, Bld: 96 mg/dL (ref 70–99)
Potassium: 3.8 mmol/L (ref 3.5–5.1)
Sodium: 142 mmol/L (ref 135–145)

## 2020-05-12 MED ORDER — CHLORHEXIDINE GLUCONATE CLOTH 2 % EX PADS: 6.0000 | MEDICATED_PAD | Freq: Once | CUTANEOUS | Status: DC

## 2020-05-12 NOTE — H&P (Signed)
[name] Appointment: 04/21/2020 9:20 AM Location: [facility] Patient #: [id number] DOB: 1961-03-14 Unknown / Language: [place] / Race: Black or African American Male   History of Present Illness Antonio Lam A. Magnus Ivan MD; 04/21/2020 9:38 AM) The patient is a 59 year old male who presents with an inguinal hernia.  Chief complaint: Right inguinal hernia  This is a 59 year old gentleman who has had a previous left inguinal hernia repaired twice with mesh elsewhere who is now developed a bulge in the right inguinal area. He reports a will reduce easily but is very painful. He had an unremarkable CT scan of the abdomen. He has otherwise without complaint. He describes the pain as moderate to severe. He has no nausea or vomiting.   Past Surgical History (Chanel Lonni Fix, CMA; 04/21/2020 9:19 AM) No pertinent past surgical history   Allergies (Chanel Lonni Fix, CMA; 04/21/2020 9:20 AM) No Known Drug Allergies  [04/21/2020]: Allergies Reconciled   Medication History (Chanel Lonni Fix, CMA; 04/21/2020 9:21 AM) Bentyl (20MG  Tablet, Oral) Active. amLODIPine Besylate (5MG  Tablet, Oral) Active. Lisinopril-hydroCHLOROthiazide (Oral) Specific strength unknown - Active. Pravastatin Sodium (40MG  Tablet, Oral) Active. Aspirin (81MG  Tablet, Oral) Active. Medications Reconciled  Other Problems (Chanel , CMA; 04/21/2020 9:19 AM) Back Pain  High blood pressure     Review of Systems (Chanel Nolan CMA; 04/21/2020 9:19 AM) General Not Present- Appetite Loss, Chills, Fatigue, Fever, Night Sweats, Weight Gain and Weight Loss. Skin Not Present- Change in Wart/Mole, Dryness, Hives, Jaundice, New Lesions, Non-Healing Wounds, Rash and Ulcer. HEENT Not Present- Earache, Hearing Loss, Hoarseness, Nose Bleed, Oral Ulcers, Ringing in the Ears, Seasonal Allergies, Sinus Pain, Sore Throat, Visual Disturbances, Wears glasses/contact lenses and Yellow Eyes. Respiratory Not Present- Bloody  sputum, Chronic Cough, Difficulty Breathing, Snoring and Wheezing. Breast Not Present- Breast Mass, Breast Pain, Nipple Discharge and Skin Changes. Cardiovascular Not Present- Chest Pain, Difficulty Breathing Lying Down, Leg Cramps, Palpitations, Rapid Heart Rate, Shortness of Breath and Swelling of Extremities. Gastrointestinal Not Present- Abdominal Pain, Bloating, Bloody Stool, Change in Bowel Habits, Chronic diarrhea, Constipation, Difficulty Swallowing, Excessive gas, Gets full quickly at meals, Hemorrhoids, Indigestion, Nausea, Rectal Pain and Vomiting. Male Genitourinary Not Present- Blood in Urine, Change in Urinary Stream, Frequency, Impotence, Nocturia, Painful Urination, Urgency and Urine Leakage. Musculoskeletal Not Present- Back Pain, Joint Pain, Joint Stiffness, Muscle Pain, Muscle Weakness and Swelling of Extremities. Neurological Not Present- Decreased Memory, Fainting, Headaches, Numbness, Seizures, Tingling, Tremor, Trouble walking and Weakness. Psychiatric Not Present- Anxiety, Bipolar, Change in Sleep Pattern, Depression, Fearful and Frequent crying. Endocrine Not Present- Cold Intolerance, Excessive Hunger, Hair Changes, Heat Intolerance, Hot flashes and New Diabetes. Hematology Not Present- Blood Thinners, Easy Bruising, Excessive bleeding, Gland problems, HIV and Persistent Infections.  Vitals (Chanel Nolan CMA; 04/21/2020 9:21 AM) 04/21/2020 9:21 AM Weight: 165.38 lb Height: 68in Body Surface Area: 1.89 m Body Mass Index: 25.14 kg/m  Temp.: 98.75F  Pulse: 94 (Regular)        Physical Exam (Tyjah Hai A. 04/23/2020 MD; 04/21/2020 9:38 AM) The physical exam findings are as follows: Note: He appears well exam  His abdomen is soft. He has a well-healed incision in the left inguinal area. He has a moderate size, easily reducible tender right inguinal hernia.    Assessment & Plan   RIGHT INGUINAL HERNIA (K40.90)  Impression: I reviewed his notes in the  electronic medical records and his most recent CAT scan of the abdomen and pelvis. On exam, he has an obvious, easily reducible right inguinal hernia which  is symptomatic. He requests hernia repair with mesh given his previous history of a left hernia repair. I discussed the surgical procedure with him in detail. I discussed the risks which includes but is not limited to bleeding, infection, injury to surrounding structures, use of mesh, nerve entrapment, chronic pain, hernia recurrence, postoperative care, etc. He understands and wished to proceed with surgery which will be scheduled

## 2020-05-13 ENCOUNTER — Ambulatory Visit (HOSPITAL_BASED_OUTPATIENT_CLINIC_OR_DEPARTMENT_OTHER): Payer: Medicaid Other | Admitting: Anesthesiology

## 2020-05-13 ENCOUNTER — Encounter (HOSPITAL_BASED_OUTPATIENT_CLINIC_OR_DEPARTMENT_OTHER)
Admission: RE | Disposition: A | Discharge status: Home / Self Care | Payer: Self-pay | Source: Home / Self Care | Attending: Surgery

## 2020-05-13 ENCOUNTER — Encounter (HOSPITAL_BASED_OUTPATIENT_CLINIC_OR_DEPARTMENT_OTHER): Payer: Self-pay | Admitting: Surgery

## 2020-05-13 ENCOUNTER — Other Ambulatory Visit: Payer: Self-pay

## 2020-05-13 ENCOUNTER — Ambulatory Visit (HOSPITAL_BASED_OUTPATIENT_CLINIC_OR_DEPARTMENT_OTHER)
Admission: RE | Admit: 2020-05-13 | Discharge: 2020-05-13 | Disposition: A | Discharge status: Home / Self Care | Payer: Medicaid Other | Attending: Surgery | Admitting: Surgery

## 2020-05-13 DIAGNOSIS — E785 Hyperlipidemia, unspecified: Secondary | ICD-10-CM | POA: Diagnosis not present

## 2020-05-13 DIAGNOSIS — F17200 Nicotine dependence, unspecified, uncomplicated: Secondary | ICD-10-CM | POA: Diagnosis not present

## 2020-05-13 DIAGNOSIS — I10 Essential (primary) hypertension: Secondary | ICD-10-CM | POA: Diagnosis not present

## 2020-05-13 DIAGNOSIS — Z79899 Other long term (current) drug therapy: Secondary | ICD-10-CM | POA: Diagnosis not present

## 2020-05-13 DIAGNOSIS — K4090 Unilateral inguinal hernia, without obstruction or gangrene, not specified as recurrent: Secondary | ICD-10-CM | POA: Insufficient documentation

## 2020-05-13 HISTORY — PX: INSERTION OF MESH: SHX5868

## 2020-05-13 HISTORY — PX: INGUINAL HERNIA REPAIR: SHX194

## 2020-05-13 SURGERY — REPAIR, HERNIA, INGUINAL, ADULT
Anesthesia: Regional | Site: Groin | Laterality: Right

## 2020-05-13 MED ORDER — FENTANYL CITRATE (PF) 100 MCG/2ML IJ SOLN
100.0000 ug | Freq: Once | INTRAMUSCULAR | Status: AC
  Administered 2020-05-13: 100 ug via INTRAVENOUS

## 2020-05-13 MED ORDER — DEXAMETHASONE SODIUM PHOSPHATE 10 MG/ML IJ SOLN
INTRAMUSCULAR | Status: DC | PRN
  Administered 2020-05-13: 10 mg via INTRAVENOUS

## 2020-05-13 MED ORDER — CLONIDINE HCL (ANALGESIA) 100 MCG/ML EP SOLN
EPIDURAL | Status: DC | PRN
  Administered 2020-05-13: 100 ug

## 2020-05-13 MED ORDER — OXYCODONE HCL 5 MG PO TABS: 5.0000 mg | ORAL_TABLET | Freq: Four times a day (QID) | ORAL | 0 refills | Status: DC | PRN

## 2020-05-13 MED ORDER — PROPOFOL 10 MG/ML IV BOLUS
INTRAVENOUS | Status: DC | PRN
  Administered 2020-05-13: 150 mg via INTRAVENOUS

## 2020-05-13 MED ORDER — ACETAMINOPHEN 500 MG PO TABS
ORAL_TABLET | ORAL | Status: AC
  Filled 2020-05-13: qty 2

## 2020-05-13 MED ORDER — MIDAZOLAM HCL 2 MG/2ML IJ SOLN
2.0000 mg | Freq: Once | INTRAMUSCULAR | Status: AC
  Administered 2020-05-13: 2 mg via INTRAVENOUS

## 2020-05-13 MED ORDER — CEFAZOLIN SODIUM-DEXTROSE 2-4 GM/100ML-% IV SOLN
INTRAVENOUS | Status: AC
  Filled 2020-05-13: qty 100

## 2020-05-13 MED ORDER — GABAPENTIN 300 MG PO CAPS
ORAL_CAPSULE | ORAL | Status: AC
  Filled 2020-05-13: qty 1

## 2020-05-13 MED ORDER — ROPIVACAINE HCL 5 MG/ML IJ SOLN
INTRAMUSCULAR | Status: DC | PRN
  Administered 2020-05-13: 30 mL via PERINEURAL

## 2020-05-13 MED ORDER — LIDOCAINE 2% (20 MG/ML) 5 ML SYRINGE
INTRAMUSCULAR | Status: AC
  Filled 2020-05-13: qty 5

## 2020-05-13 MED ORDER — FENTANYL CITRATE (PF) 100 MCG/2ML IJ SOLN
25.0000 ug | INTRAMUSCULAR | Status: DC | PRN
  Administered 2020-05-13: 25 ug via INTRAVENOUS

## 2020-05-13 MED ORDER — LIDOCAINE 2% (20 MG/ML) 5 ML SYRINGE
INTRAMUSCULAR | Status: DC | PRN
  Administered 2020-05-13: 40 mg via INTRAVENOUS

## 2020-05-13 MED ORDER — CEFAZOLIN SODIUM-DEXTROSE 2-4 GM/100ML-% IV SOLN
2.0000 g | INTRAVENOUS | Status: AC
  Administered 2020-05-13: 2 g via INTRAVENOUS

## 2020-05-13 MED ORDER — FENTANYL CITRATE (PF) 100 MCG/2ML IJ SOLN
INTRAMUSCULAR | Status: AC
  Filled 2020-05-13: qty 2

## 2020-05-13 MED ORDER — PROMETHAZINE HCL 25 MG/ML IJ SOLN: 6.2500 mg | INTRAMUSCULAR | Status: DC | PRN

## 2020-05-13 MED ORDER — ACETAMINOPHEN 500 MG PO TABS
1000.0000 mg | ORAL_TABLET | ORAL | Status: AC
  Administered 2020-05-13: 1000 mg via ORAL

## 2020-05-13 MED ORDER — BUPIVACAINE HCL 0.25 % IJ SOLN
INTRAMUSCULAR | Status: DC | PRN
  Administered 2020-05-13: 10 mL

## 2020-05-13 MED ORDER — MIDAZOLAM HCL 2 MG/2ML IJ SOLN
INTRAMUSCULAR | Status: AC
  Filled 2020-05-13: qty 2

## 2020-05-13 MED ORDER — ENSURE PRE-SURGERY PO LIQD: 296.0000 mL | Freq: Once | ORAL | Status: DC

## 2020-05-13 MED ORDER — CELECOXIB 200 MG PO CAPS
400.0000 mg | ORAL_CAPSULE | ORAL | Status: AC
  Administered 2020-05-13: 400 mg via ORAL

## 2020-05-13 MED ORDER — ONDANSETRON HCL 4 MG/2ML IJ SOLN
INTRAMUSCULAR | Status: DC | PRN
  Administered 2020-05-13: 4 mg via INTRAVENOUS

## 2020-05-13 MED ORDER — OXYCODONE HCL 5 MG PO TABS: 5.0000 mg | ORAL_TABLET | Freq: Once | ORAL | Status: DC | PRN

## 2020-05-13 MED ORDER — CELECOXIB 200 MG PO CAPS
ORAL_CAPSULE | ORAL | Status: AC
  Filled 2020-05-13: qty 2

## 2020-05-13 MED ORDER — FENTANYL CITRATE (PF) 100 MCG/2ML IJ SOLN
INTRAMUSCULAR | Status: DC | PRN
  Administered 2020-05-13: 50 ug via INTRAVENOUS

## 2020-05-13 MED ORDER — LACTATED RINGERS IV SOLN: INTRAVENOUS | Status: DC

## 2020-05-13 MED ORDER — GABAPENTIN 300 MG PO CAPS
300.0000 mg | ORAL_CAPSULE | ORAL | Status: AC
  Administered 2020-05-13: 300 mg via ORAL

## 2020-05-13 MED ORDER — OXYCODONE HCL 5 MG/5ML PO SOLN: 5.0000 mg | Freq: Once | ORAL | Status: DC | PRN

## 2020-05-13 SURGICAL SUPPLY — 44 items
ADH SKN CLS APL DERMABOND .7 (GAUZE/BANDAGES/DRESSINGS) ×1
APL PRP STRL LF DISP 70% ISPRP (MISCELLANEOUS) ×1
BLADE CLIPPER SURG (BLADE) ×2 IMPLANT
BLADE SURG 15 STRL LF DISP TIS (BLADE) ×1 IMPLANT
BLADE SURG 15 STRL SS (BLADE) ×2
CANISTER SUCT 1200ML W/VALVE (MISCELLANEOUS) IMPLANT
CHLORAPREP W/TINT 26 (MISCELLANEOUS) ×2 IMPLANT
COVER BACK TABLE 60X90IN (DRAPES) ×2 IMPLANT
COVER MAYO STAND STRL (DRAPES) ×2 IMPLANT
COVER WAND RF STERILE (DRAPES) IMPLANT
DECANTER SPIKE VIAL GLASS SM (MISCELLANEOUS) IMPLANT
DERMABOND ADVANCED (GAUZE/BANDAGES/DRESSINGS) ×1
DERMABOND ADVANCED .7 DNX12 (GAUZE/BANDAGES/DRESSINGS) ×1 IMPLANT
DRAIN PENROSE 1/2X12 LTX STRL (WOUND CARE) ×2 IMPLANT
DRAPE LAPAROTOMY 100X72 PEDS (DRAPES) ×2 IMPLANT
DRAPE UTILITY XL STRL (DRAPES) ×2 IMPLANT
ELECT REM PT RETURN 9FT ADLT (ELECTROSURGICAL) ×2
ELECTRODE REM PT RTRN 9FT ADLT (ELECTROSURGICAL) ×1 IMPLANT
GLOVE BIO SURGEON STRL SZ 6.5 (GLOVE) ×1 IMPLANT
GLOVE SURG SIGNA 7.5 PF LTX (GLOVE) ×2 IMPLANT
GOWN STRL REUS W/ TWL LRG LVL3 (GOWN DISPOSABLE) ×1 IMPLANT
GOWN STRL REUS W/ TWL XL LVL3 (GOWN DISPOSABLE) ×1 IMPLANT
GOWN STRL REUS W/TWL LRG LVL3 (GOWN DISPOSABLE) ×2
GOWN STRL REUS W/TWL XL LVL3 (GOWN DISPOSABLE) ×2
MESH PARIETEX PROGRIP RIGHT (Mesh General) ×1 IMPLANT
NDL HYPO 25X1 1.5 SAFETY (NEEDLE) ×1 IMPLANT
NEEDLE HYPO 25X1 1.5 SAFETY (NEEDLE) ×2 IMPLANT
NS IRRIG 1000ML POUR BTL (IV SOLUTION) ×1 IMPLANT
PACK BASIN DAY SURGERY FS (CUSTOM PROCEDURE TRAY) ×2 IMPLANT
PENCIL SMOKE EVACUATOR (MISCELLANEOUS) ×2 IMPLANT
SLEEVE SCD COMPRESS KNEE MED (MISCELLANEOUS) ×2 IMPLANT
SPONGE INTESTINAL PEANUT (DISPOSABLE) IMPLANT
SPONGE LAP 4X18 RFD (DISPOSABLE) ×2 IMPLANT
SUT MNCRL AB 4-0 PS2 18 (SUTURE) ×2 IMPLANT
SUT SILK 2 0 SH (SUTURE) IMPLANT
SUT VIC AB 2-0 CT1 27 (SUTURE) ×4
SUT VIC AB 2-0 CT1 TAPERPNT 27 (SUTURE) ×2 IMPLANT
SUT VIC AB 3-0 CT1 27 (SUTURE) ×2
SUT VIC AB 3-0 CT1 27XBRD (SUTURE) ×1 IMPLANT
SYR BULB EAR ULCER 3OZ GRN STR (SYRINGE) IMPLANT
SYR CONTROL 10ML LL (SYRINGE) ×2 IMPLANT
TOWEL GREEN STERILE FF (TOWEL DISPOSABLE) ×2 IMPLANT
TUBE CONNECTING 20X1/4 (TUBING) IMPLANT
YANKAUER SUCT BULB TIP NO VENT (SUCTIONS) IMPLANT

## 2020-05-13 NOTE — Discharge Instructions (Signed)
[facility] _______[facility]  UMBILICAL OR INGUINAL HERNIA REPAIR: POST OP INSTRUCTIONS  Always review your discharge instruction sheet given to you by the facility where your surgery was performed. IF YOU HAVE DISABILITY OR FAMILY LEAVE FORMS, YOU MUST BRING THEM TO THE OFFICE FOR PROCESSING.   DO NOT GIVE THEM TO YOUR DOCTOR.  1. A  prescription for pain medication may be given to you upon discharge.  Take your pain medication as prescribed, if needed.  If narcotic pain medicine is not needed, then you may take acetaminophen (Tylenol) or ibuprofen (Advil) as needed. 2. Take your usually prescribed medications unless otherwise directed. If you need a refill on your pain medication, please contact your pharmacy.  They will contact our office to request authorization. Prescriptions will not be filled after 5 pm or on week-ends. 3. You should follow a light diet the first 24 hours after arrival home, such as soup and crackers, etc.  Be sure to include lots of fluids daily.  Resume your normal diet the day after surgery. 4.Most patients will experience some swelling and bruising around the umbilicus or in the groin and scrotum.  Ice packs and reclining will help.  Swelling and bruising can take several days to resolve.  6. It is common to experience some constipation if taking pain medication after surgery.  Increasing fluid intake and taking a stool softener (such as Colace) will usually help or prevent this problem from occurring.  A mild laxative (Milk of Magnesia or Miralax) should be taken according to package directions if there are no bowel movements after 48 hours. 7. Unless discharge instructions indicate otherwise, you may remove your bandages 24-48 hours after surgery, and you may shower at that time.  You may have steri-strips (small skin tapes) in place directly over the incision.  These strips should be left on the skin for 7-10 days.  If your surgeon used skin glue on the  incision, you may shower in 24 hours.  The glue will flake off over the next 2-3 weeks.  Any sutures or staples will be removed at the office during your follow-up visit. 8. ACTIVITIES:  You may resume regular (light) daily activities beginning the next day--such as daily self-care, walking, climbing stairs--gradually increasing activities as tolerated.  You may have sexual intercourse when it is comfortable.  Refrain from any heavy lifting or straining until approved by your doctor.  a.You may drive when you are no longer taking prescription pain medication, you can comfortably wear a seatbelt, and you can safely maneuver your car and apply brakes. b.RETURN TO WORK:   _____________________________________________  9.You should see your doctor in the office for a follow-up appointment approximately 2-3 weeks after your surgery.  Make sure that you call for this appointment within a day or two after you arrive home to insure a convenient appointment time. 10.OTHER INSTRUCTIONS: OK TO SHOWER STARTING TOMORROW ICE PACK, TYLENOL, AND IBUPROFEN ALSO FOR PAIN NO VIGOROUS ACTIVITY FOR ONE WEEK_________________________    _____________________________________  WHEN TO CALL YOUR DOCTOR: 1. Fever over 101.0 2. Inability to urinate 3. Nausea and/or vomiting 4. Extreme swelling or bruising 5. Continued bleeding from incision. 6. Increased pain, redness, or drainage from the incision  The clinic staff is available to answer your questions during regular business hours.  Please don't hesitate to call and ask to speak to one of the nurses for clinical concerns.  If you have a medical emergency, go to the nearest emergency room or  call 911.  A surgeon from [facility] is always on call at the hospital   [address], [address], [place], [place]  [address] ?  [address] [address], [place], [place]   [address] [phone] ? [phone] ? FAX [phone] Web site:  [email or web address]   Post Anesthesia Home Care Instructions  Activity: Get plenty of rest for the remainder of the day. A responsible individual must stay with you for 24 hours following the procedure.  For the next 24 hours, DO NOT: -Drive a car -Advertising copywriter -Drink alcoholic beverages -Take any medication unless instructed by your physician -Make any legal decisions or sign important papers.  Meals: Start with liquid foods such as gelatin or soup. Progress to regular foods as tolerated. Avoid greasy, spicy, heavy foods. If nausea and/or vomiting occur, drink only clear liquids until the nausea and/or vomiting subsides. Call your physician if vomiting continues.  Special Instructions/Symptoms: Your throat may feel dry or sore from the anesthesia or the breathing tube placed in your throat during surgery. If this causes discomfort, gargle with warm salt water. The discomfort should disappear within 24 hours.  If you had a scopolamine patch placed behind your ear for the management of post- operative nausea and/or vomiting:  1. The medication in the patch is effective for 72 hours, after which it should be removed.  Wrap patch in a tissue and discard in the trash. Wash hands thoroughly with soap and water. 2. You may remove the patch earlier than 72 hours if you experience unpleasant side effects which may include dry mouth, dizziness or visual disturbances. 3. Avoid touching the patch. Wash your hands with soap and water after contact with the patch.    Regional Anesthesia Blocks  1. Numbness or the inability to move the "blocked" extremity may last from 3-48 hours after placement. The length of time depends on the medication injected and your individual response to the medication. If the numbness is not going away after 48 hours, call your surgeon.  2. The extremity that is blocked will need to be protected until the numbness is gone and the  Strength has returned.  Because you cannot feel it, you will need to take extra care to avoid injury. Because it may be weak, you may have difficulty moving it or using it. You may not know what position it is in without looking at it while the block is in effect.  3. For blocks in the legs and feet, returning to weight bearing and walking needs to be done carefully. You will need to wait until the numbness is entirely gone and the strength has returned. You should be able to move your leg and foot normally before you try and bear weight or walk. You will need someone to be with you when you first try to ensure you do not fall and possibly risk injury.  4. Bruising and tenderness at the needle site are common side effects and will resolve in a few days.  5. Persistent numbness or new problems with movement should be communicated to the surgeon or the [facility] [phone] [facility] [phone]).

## 2020-05-13 NOTE — Anesthesia Preprocedure Evaluation (Addendum)
Anesthesia Evaluation  Patient identified by MRN, date of birth, ID band Patient awake    Reviewed: Allergy & Precautions, NPO status , Patient's Chart, lab work & pertinent test results  Airway Mallampati: II  TM Distance: >3 FB Neck ROM: Full    Dental no notable dental hx.    Pulmonary Current SmokerPatient did not abstain from smoking.,    Pulmonary exam normal breath sounds clear to auscultation       Cardiovascular hypertension, Pt. on medications Normal cardiovascular exam Rhythm:Regular Rate:Normal  ECG: SR, rate 82    Neuro/Psych negative neurological ROS  negative psych ROS   GI/Hepatic negative GI ROS, Neg liver ROS,   Endo/Other  negative endocrine ROS  Renal/GU negative Renal ROS     Musculoskeletal negative musculoskeletal ROS (+)   Abdominal   Peds  Hematology HLD   Anesthesia Other Findings RIGHT INGUINAL HERNIA  Reproductive/Obstetrics                            Anesthesia Physical Anesthesia Plan  ASA: II  Anesthesia Plan: General and Regional   Post-op Pain Management: GA combined w/ Regional for post-op pain   Induction: Intravenous  PONV Risk Score and Plan: 2 and Ondansetron, Dexamethasone, Midazolam and Treatment may vary due to age or medical condition  Airway Management Planned: LMA  Additional Equipment:   Intra-op Plan:   Post-operative Plan: Extubation in OR  Informed Consent: I have reviewed the patients History and Physical, chart, labs and discussed the procedure including the risks, benefits and alternatives for the proposed anesthesia with the patient or authorized representative who has indicated his/her understanding and acceptance.     Dental advisory given  Plan Discussed with: CRNA  Anesthesia Plan Comments:        Anesthesia Quick Evaluation

## 2020-05-13 NOTE — Anesthesia Procedure Notes (Signed)
Procedure Name: LMA Insertion Date/Time: 05/13/2020 10:01 AM Performed by: [place], Kento Gossman C, CRNA Pre-anesthesia Checklist: Patient identified, Emergency Drugs available, Suction available and Patient being monitored Patient Re-evaluated:Patient Re-evaluated prior to induction Oxygen Delivery Method: Circle system utilized Preoxygenation: Pre-oxygenation with 100% oxygen Induction Type: IV induction Ventilation: Mask ventilation without difficulty LMA: LMA inserted LMA Size: 4.0 Number of attempts: 1 Airway Equipment and Method: Bite block Placement Confirmation: positive ETCO2 Tube secured with: Tape Dental Injury: Teeth and Oropharynx as per pre-operative assessment

## 2020-05-13 NOTE — Interval H&P Note (Signed)
History and Physical Interval Note: no change in H and P  05/13/2020 9:44 AM  Antonio Small  has presented today for surgery, with the diagnosis of RIGHT INGUINAL HERNIA.  The various methods of treatment have been discussed with the patient and family. After consideration of risks, benefits and other options for treatment, the patient has consented to  Procedure(s) with comments: RIGHT INGUINAL HERNIA REPAIR WITH MESH (Right) - LMA AND TAP BLOCK as a surgical intervention.  The patient's history has been reviewed, patient examined, no change in status, stable for surgery.  I have reviewed the patient's chart and labs.  Questions were answered to the patient's satisfaction.     [name]

## 2020-05-13 NOTE — Progress Notes (Signed)
Assisted Dr. Ellender with right, ultrasound guided, transabdominal plane block. Side rails up, monitors on throughout procedure. See vital signs in flow sheet. Tolerated Procedure well. ?

## 2020-05-13 NOTE — Transfer of Care (Signed)
Immediate Anesthesia Transfer of Care Note  Patient: JOCSAN MCGINLEY  Procedure(s) Performed: RIGHT INGUINAL HERNIA REPAIR WITH MESH (Right Groin)  Patient Location: PACU  Anesthesia Type:GA combined with regional for post-op pain  Level of Consciousness: awake and drowsy  Airway & Oxygen Therapy: Patient Spontanous Breathing and Patient connected to face mask oxygen  Post-op Assessment: Report given to RN and Post -op Vital signs reviewed and stable  Post vital signs: Reviewed and unstable  Last Vitals:  Vitals Value Taken Time  BP 120/92 05/13/20 1045  Temp    Pulse 66 05/13/20 1048  Resp 13 05/13/20 1048  SpO2 100 % 05/13/20 1048  Vitals shown include unvalidated device data.  Last Pain:  Vitals:   05/13/20 0907  TempSrc: Oral  PainSc: 0-No pain      Patients Stated Pain Goal: 5 (05/13/20 3567)  Complications: No complications documented.

## 2020-05-13 NOTE — Anesthesia Postprocedure Evaluation (Signed)
Anesthesia Post Note  Patient: Antonio Small  Procedure(s) Performed: RIGHT INGUINAL HERNIA REPAIR WITH MESH (Right Groin) INSERTION OF MESH (Right Groin)     Patient location during evaluation: PACU Anesthesia Type: Regional and General Level of consciousness: awake Pain management: pain level controlled Vital Signs Assessment: post-procedure vital signs reviewed and stable Respiratory status: spontaneous breathing, nonlabored ventilation, respiratory function stable and patient connected to nasal cannula oxygen Cardiovascular status: blood pressure returned to baseline and stable Postop Assessment: no apparent nausea or vomiting Anesthetic complications: no   No complications documented.  Last Vitals:  Vitals:   05/13/20 1130 05/13/20 1215  BP: 116/76 119/88  Pulse: 66 61  Resp: 17 16  Temp:  36.5 C  SpO2: 96% 98%    Last Pain:  Vitals:   05/13/20 1215  TempSrc:   PainSc: Asleep                 Ninoska Goswick P Kiwana Deblasi

## 2020-05-13 NOTE — Anesthesia Procedure Notes (Signed)
Anesthesia Regional Block: TAP block   Pre-Anesthetic Checklist: ,, timeout performed, Correct Patient, Correct Site, Correct Laterality, Correct Procedure, Correct Position, site marked, Risks and benefits discussed,  Surgical consent,  Pre-op evaluation,  At surgeon's request and post-op pain management  Laterality: Right  Prep: chloraprep       Needles:  Injection technique: Single-shot  Needle Type: Echogenic Stimulator Needle     Needle Length: 10cm  Needle Gauge: 20     Additional Needles:   Procedures:,,,, ultrasound used (permanent image in chart),,,,  Narrative:  Start time: 05/13/2020 9:25 AM End time: 05/13/2020 9:35 AM Injection made incrementally with aspirations every 5 mL.  Performed by: Personally  Anesthesiologist: Leonides Grills, MD  Additional Notes: Functioning IV was confirmed and monitors were applied.  A timeout was performed. Sterile prep, hand hygiene and sterile gloves were used. A 20ga BBraun echogenic stimulator needle was used. Negative aspiration and negative test dose prior to incremental administration of local anesthetic. The patient tolerated the procedure well.  Ultrasound guidance: relevent anatomy identified, needle position confirmed, local anesthetic spread visualized around nerve(s), vascular puncture avoided.  Image printed for medical record.

## 2020-05-13 NOTE — Op Note (Signed)
RIGHT INGUINAL HERNIA REPAIR WITH MESH  Procedure Note  Antonio Small 05/13/2020   Pre-op Diagnosis: RIGHT INGUINAL HERNIA     Post-op Diagnosis: same  Procedure(s): RIGHT INGUINAL HERNIA REPAIR WITH MESH  Surgeon(s): [name], MD  Anesthesia: General  Staff:  Circulator: Maryan Rued, RN Scrub Person: Wardell Heath, CST  Estimated Blood Loss: Minimal               Findings: The patient found to have an indirect right inguinal hernia which was repaired with a piece of large Prolene ProGrip mesh from [employer]  Procedure: Patient brought to operating identifies correct patient.  He is placed prone on the operating table general anesthesia was induced.  He had already undergone a tap block by anesthesiology.  His abdomen was prepped and draped in usual sterile fashion.  I anesthetized the skin in the right inguinal area with Marcaine.  I then made a longitudinal incision with a scalpel.  I then dissected down through Scarpa's fascia with electrocautery.  The external beak fascia was identified and opened with internal and external rings.  The testicular cord and structures were controlled with a Penrose drain.  The patient had a thickened indirect hernia sac.  I dissected away from the cord structures and down to the base at the exit from the internal ring.  The contents have been reduced.  I tied off the base of the 2-0 silk suture and excised the redundant sac.  A large piece of Prolene ProGrip mesh was brought to the field.  I placed it against the pubic tubercle and then brought around the cord structures.  I then sutured in place to the pubic tubercle with a 2-0 Vicryl suture.  Wide coverage of the inguinal floor and internal ring appeared to be achieved.  I then closed the external beak fascia over the top of the mesh with a running 2-0 Vicryl suture.  Scarpa's fascia was then closed interrupted 3-0 Vicryl sutures and the skin was closed with running 4-0 Monocryl.   Dermabond was then applied.  Patient tolerated the procedure well.  All the counts were correct at the end of the procedure.  Patient was then extubated in the operating room and taken in stable addition to the recovery room.`          [name]   Date: 05/13/2020  Time: 10:39 AM

## 2020-05-14 ENCOUNTER — Encounter (HOSPITAL_BASED_OUTPATIENT_CLINIC_OR_DEPARTMENT_OTHER): Payer: Self-pay | Admitting: Surgery

## 2020-05-14 NOTE — Addendum Note (Signed)
Addendum  created 05/14/20 1207 by Orvin Netter, Jewel Baize, CRNA   Charge Capture section accepted

## 2021-01-11 ENCOUNTER — Ambulatory Visit (HOSPITAL_COMMUNITY)
Admission: RE | Admit: 2021-01-11 | Discharge: 2021-01-11 | Disposition: A | Discharge status: Home / Self Care | Payer: Medicaid Other | Source: Ambulatory Visit | Attending: Family Medicine | Admitting: Family Medicine

## 2021-01-11 ENCOUNTER — Other Ambulatory Visit: Payer: Self-pay

## 2021-01-11 ENCOUNTER — Other Ambulatory Visit (HOSPITAL_COMMUNITY): Payer: Self-pay | Admitting: Family Medicine

## 2021-01-11 DIAGNOSIS — M65342 Trigger finger, left ring finger: Secondary | ICD-10-CM | POA: Insufficient documentation

## 2021-01-25 ENCOUNTER — Ambulatory Visit (INDEPENDENT_AMBULATORY_CARE_PROVIDER_SITE_OTHER): Payer: Medicaid Other | Admitting: Orthopedic Surgery

## 2021-01-25 ENCOUNTER — Other Ambulatory Visit: Payer: Self-pay

## 2021-01-25 ENCOUNTER — Encounter: Payer: Self-pay | Admitting: Orthopedic Surgery

## 2021-01-25 VITALS — BP 170/115 | HR 88 | Ht 68.0 in | Wt 177.0 lb

## 2021-01-25 DIAGNOSIS — M65342 Trigger finger, left ring finger: Secondary | ICD-10-CM

## 2021-01-25 NOTE — Patient Instructions (Signed)

## 2021-01-25 NOTE — Progress Notes (Signed)
New Patient Visit  Assessment: Antonio Small is a 60 y.o. male with the following: 1. Trigger finger, left ring finger  Plan: Mr. Mcduffee has pain, and catching sensations of the left ring finger, consistent with trigger finger.  I explained the pathology, and the options moving forward.  After answering all of his questions, he elected to proceed with a steroid injection.  If this provides sustained relief for several months, we can consider repeating the injection.  If this does not provide significant relief the next step would be consideration for surgery.  Follow-up as needed.    Procedure note injection - Left Ring finger A1 Pulley  Verbal consent was obtained to inject the Left Ring finger A1 pulley Timeout was completed to confirm the site of injection.  The skin was prepped with alcohol and ethyl chloride was sprayed at the injection site.  A 21-gauge needle was used to inject 40 mg of Depo-Medrol and 1% lidocaine (1 cc) into the Left Ring finger using a direct anterior approach.  There were no complications. Patient tolerated the procedure well. A sterile bandage was applied  Follow-up: Return if symptoms worsen or fail to improve.  Subjective:  Chief Complaint  Patient presents with   Hand Pain    Lt hand ring finger catching for a couple weeks. Feels better with splint but still catches and he has to pop it back into place.     History of Present Illness: Antonio Small is a 60 y.o. ambidextrous male who has been referred to clinic today by Oval Linsey, MD for evaluation of left ring finger pain.  He states its been ongoing for the past several weeks.  Is progressively worsening.  He has pain in the ring finger.  No previous injury.  He also notes a catching sensation, which is painful.  Occasionally, he was as to use his right hand to fully extend his finger.  He has been wearing an extension splint on his finger recently, which is improving some of the pain.  He  notes his symptoms are worse in the morning.  Review of Systems: No fevers or chills No numbness or tingling No chest pain No shortness of breath No bowel or bladder dysfunction No GI distress No headaches   Medical History:  Past Medical History:  Diagnosis Date   Hypertension     Past Surgical History:  Procedure Laterality Date   BACK SURGERY     HERNIA REPAIR     INGUINAL HERNIA REPAIR Right 05/13/2020   Procedure: RIGHT INGUINAL HERNIA REPAIR WITH MESH;  Surgeon: [name], MD;  Location: [facility];  Service: General;  Laterality: Right;   INSERTION OF MESH Right 05/13/2020   Procedure: INSERTION OF MESH;  Surgeon: [name], MD;  Location: [facility];  Service: General;  Laterality: Right;    History reviewed. No pertinent family history. Social History   Tobacco Use   Smoking status: Every Day    Packs/day: 0.50    Pack years: 0.00    Types: Cigarettes   Smokeless tobacco: Never  Vaping Use   Vaping Use: Never used  Substance Use Topics   Alcohol use: Yes    Comment: occassionally   Drug use: No    No Known Allergies  Current Meds  Medication Sig   amLODipine (NORVASC) 5 MG tablet Take 1 tablet (5 mg total) by mouth every morning.   aspirin EC 81 MG tablet Take 81 mg by mouth  every morning.   dicyclomine (BENTYL) 20 MG tablet Take 1 tablet (20 mg total) by mouth 3 (three) times daily as needed for spasms (abdominal pain).   ibuprofen (ADVIL) 800 MG tablet Take 1 tablet (800 mg total) by mouth every 8 (eight) hours as needed.   lisinopril-hydrochlorothiazide (ZESTORETIC) 20-25 MG tablet Take 1 tablet by mouth daily.   pravastatin (PRAVACHOL) 40 MG tablet Take 1 tablet (40 mg total) by mouth every morning.    Objective: BP (!) 170/115   Pulse 88   Ht 5\' 8"  (1.727 m)   Wt 177 lb (80.3 kg)   BMI 26.91 kg/m   Physical Exam:  General: Alert and oriented.  No acute distress. Gait: Normal  gait  Evaluation of the left hand demonstrates no deformity.  No swelling is appreciated.  There is no bruising.  He has tenderness over the A1 pulley to the ring finger.  It is very painful for him to make a full fist.  When he attempts to clench his fist tighter, this is very painful.  No active triggering noted in clinic today.  Fingers are warm and well-perfused.  Sensation is intact throughout the left hand.    IMAGING: I personally reviewed images previously obtained in clinic  X-rays of the left hand were previously obtained in clinic, and there are no acute injuries.  Well-maintained joint spaces.  No evidence of trauma.  No soft tissue swelling is appreciated.   New Medications:  No orders of the defined types were placed in this encounter.     , MD  01/25/2021 9:17 AM

## 2021-02-15 ENCOUNTER — Encounter: Payer: Self-pay | Admitting: Nurse Practitioner

## 2021-02-15 ENCOUNTER — Ambulatory Visit (INDEPENDENT_AMBULATORY_CARE_PROVIDER_SITE_OTHER): Payer: Medicaid Other | Admitting: Nurse Practitioner

## 2021-02-15 ENCOUNTER — Other Ambulatory Visit: Payer: Self-pay

## 2021-02-15 VITALS — BP 162/97 | HR 85 | Temp 98.3°F | Ht 68.0 in | Wt 175.0 lb

## 2021-02-15 DIAGNOSIS — I10 Essential (primary) hypertension: Secondary | ICD-10-CM | POA: Diagnosis not present

## 2021-02-15 DIAGNOSIS — G8929 Other chronic pain: Secondary | ICD-10-CM

## 2021-02-15 DIAGNOSIS — E785 Hyperlipidemia, unspecified: Secondary | ICD-10-CM

## 2021-02-15 DIAGNOSIS — Z139 Encounter for screening, unspecified: Secondary | ICD-10-CM | POA: Diagnosis not present

## 2021-02-15 DIAGNOSIS — M545 Low back pain, unspecified: Secondary | ICD-10-CM

## 2021-02-15 DIAGNOSIS — Z7689 Persons encountering health services in other specified circumstances: Secondary | ICD-10-CM

## 2021-02-15 MED ORDER — PRAVASTATIN SODIUM 40 MG PO TABS: 40.0000 mg | ORAL_TABLET | Freq: Every morning | ORAL | 3 refills | Status: DC

## 2021-02-15 MED ORDER — AMLODIPINE BESYLATE 5 MG PO TABS: 5.0000 mg | ORAL_TABLET | Freq: Every morning | ORAL | 3 refills | Status: DC

## 2021-02-15 MED ORDER — LISINOPRIL-HYDROCHLOROTHIAZIDE 20-25 MG PO TABS: 1.0000 | ORAL_TABLET | Freq: Every day | ORAL | 3 refills | Status: DC

## 2021-02-15 NOTE — Patient Instructions (Signed)
Please have fasting labs drawn today. 

## 2021-02-15 NOTE — Assessment & Plan Note (Signed)
BP Readings from Last 3 Encounters:  02/15/21 (!) 162/97  01/25/21 (!) 170/115  05/13/20 119/88   -BP elevated today -takes amlodipine and lisinopril-HCTZ

## 2021-02-15 NOTE — Progress Notes (Signed)
New Patient Office Visit  Subjective:  Patient ID: Antonio Small, male    DOB: [birth year]  Age: 60 y.o. MRN: [id number]  CC:  Chief Complaint  Patient presents with   New Patient (Initial Visit)    Here to establish care, no complaints today.    HPI [name] presents for new patient visit. Transferring care from Dr. Cindie Laroche. Last physical was 3-4 years ago. Last labs was over 3 months ago.  No acute concerns. No specialists.    Past Medical History:  Diagnosis Date   Chronic back pain    Hyperlipidemia    Hypertension     Past Surgical History:  Procedure Laterality Date   BACK SURGERY     HERNIA REPAIR Left    INGUINAL HERNIA REPAIR Right 05/13/2020   Procedure: RIGHT INGUINAL HERNIA REPAIR WITH MESH;  Surgeon: Coralie Keens, MD;  Location: [facility];  Service: General;  Laterality: Right;   INSERTION OF MESH Right 05/13/2020   Procedure: INSERTION OF MESH;  Surgeon: Coralie Keens, MD;  Location: [place];  Service: General;  Laterality: Right;    Family History  Problem Relation Age of Onset   Hypertension Mother    Hypertension Father    Hypertension Sister     Social History   Socioeconomic History   Marital status: Legally Separated    Spouse name: Not on file   Number of children: 1   Years of education: Not on file   Highest education level: Not on file  Occupational History   Occupation: Disabled    Comment: d/t back pain  Tobacco Use   Smoking status: Every Day    Packs/day: 0.50    Years: 40.00    Pack years: 20.00    Types: Cigarettes   Smokeless tobacco: Never  Vaping Use   Vaping Use: Never used  Substance and Sexual Activity   Alcohol use: Not Currently    Comment: occassionally   Drug use: No   Sexual activity: Yes  Other Topics Concern   Not on file  Social History Narrative   1 daughter   Social Determinants of Health   Financial Resource Strain: Not on file  Food  Insecurity: Not on file  Transportation Needs: Not on file  Physical Activity: Not on file  Stress: Not on file  Social Connections: Not on file  Intimate Partner Violence: Not on file    ROS Review of Systems  Constitutional: Negative.   Respiratory: Negative.    Cardiovascular: Negative.   Musculoskeletal: Negative.   Psychiatric/Behavioral: Negative.     Objective:   Today's Vitals: BP (!) 162/97 (BP Location: Left Arm, Patient Position: Sitting, Cuff Size: Large)   Pulse 85   Temp 98.3 F (36.8 C) (Oral)   Ht _0  (1.727 m)   Wt 175 lb (79.4 kg)   SpO2 93%   BMI 26.61 kg/m   Physical Exam Constitutional:      Appearance: Normal appearance.  Cardiovascular:     Rate and Rhythm: Normal rate and regular rhythm.     Pulses: Normal pulses.     Heart sounds: Normal heart sounds.  Pulmonary:     Effort: Pulmonary effort is normal.     Breath sounds: Normal breath sounds.  Musculoskeletal:        General: Normal range of motion.  Neurological:     Mental Status: He is alert.  Psychiatric:        Mood and  Affect: Mood normal.        Behavior: Behavior normal.        Thought Content: Thought content normal.        Judgment: Judgment normal.    Assessment & Plan:   Problem List Items Addressed This Visit       Cardiovascular and Mediastinum   Hypertension, essential    BP Readings from Last 3 Encounters:  02/15/21 (!) 162/97  01/25/21 (!) 170/115  05/13/20 119/88  -BP elevated today -takes amlodipine and lisinopril-HCTZ       Relevant Medications   lisinopril-hydrochlorothiazide (ZESTORETIC) 20-25 MG tablet   pravastatin (PRAVACHOL) 40 MG tablet   amLODipine (NORVASC) 5 MG tablet   Other Relevant Orders   CBC with Differential/Platelet   Lipid Panel With LDL/HDL Ratio   CMP14+EGFR     Other   HLD (hyperlipidemia)    -checking labs        Relevant Medications   lisinopril-hydrochlorothiazide (ZESTORETIC) 20-25 MG tablet   pravastatin  (PRAVACHOL) 40 MG tablet   amLODipine (NORVASC) 5 MG tablet   LOW BACK PAIN    -had surgery previously; obtain records -worked on [employer] with [employer] -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   [DISCONTINUED] amLODipine (NORVASC) 5 MG tablet Take 1 tablet (5 mg total) by mouth every morning.   [DISCONTINUED] lisinopril-hydrochlorothiazide (ZESTORETIC) 20-25 MG tablet Take 1 tablet by mouth daily.   [DISCONTINUED] pravastatin (PRAVACHOL) 40 MG tablet Take 1 tablet (40 mg total) by mouth every morning.   amLODipine (NORVASC) 5 MG tablet Take 1 tablet (5 mg total) by mouth every morning.   lisinopril-hydrochlorothiazide (ZESTORETIC) 20-25 MG tablet Take 1 tablet by mouth daily.   pravastatin (PRAVACHOL) 40 MG tablet Take 1 tablet (40 mg total) by mouth every morning.   [DISCONTINUED] dicyclomine (BENTYL) 20 MG tablet Take 1 tablet (20 mg total) by mouth 3 (three) times daily as needed for spasms (abdominal pain). (Patient not taking: Reported on 02/15/2021)   [DISCONTINUED] ibuprofen (ADVIL) 800 MG tablet Take 1 tablet (800 mg total) by mouth every 8 (eight) hours as needed. (Patient not taking: Reported on 02/15/2021)   [DISCONTINUED] oxyCODONE (OXY IR/ROXICODONE) 5 MG immediate release tablet Take 1 tablet (5 mg total) by mouth every 6 (six) hours as needed for moderate pain or severe pain. (Patient not taking: No sig reported)   No facility-administered encounter medications on file as of 02/15/2021.     Follow-up: Return in about 1 week (around 02/22/2021) for Physical Exam.   Noreene Larsson, NP

## 2021-02-15 NOTE — Assessment & Plan Note (Addendum)
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

## 2021-02-15 NOTE — Assessment & Plan Note (Signed)
-  will screen for HCV and HIV with next set of labs 

## 2021-02-15 NOTE — Assessment & Plan Note (Signed)
-  checking labs 

## 2021-02-16 LAB — CBC WITH DIFFERENTIAL/PLATELET
Basophils Absolute: 0.1 10*3/uL (ref 0.0–0.2)
Basos: 1 %
EOS (ABSOLUTE): 0.2 10*3/uL (ref 0.0–0.4)
Eos: 3 %
Hematocrit: 49.4 % (ref 37.5–51.0)
Hemoglobin: 16.6 g/dL (ref 13.0–17.7)
Immature Grans (Abs): 0 10*3/uL (ref 0.0–0.1)
Immature Granulocytes: 0 %
Lymphocytes Absolute: 1.7 10*3/uL (ref 0.7–3.1)
Lymphs: 34 %
MCH: 31.4 pg (ref 26.6–33.0)
MCHC: 33.6 g/dL (ref 31.5–35.7)
MCV: 93 fL (ref 79–97)
Monocytes Absolute: 0.7 10*3/uL (ref 0.1–0.9)
Monocytes: 15 %
Neutrophils Absolute: 2.2 10*3/uL (ref 1.4–7.0)
Neutrophils: 47 %
Platelets: 226 10*3/uL (ref 150–450)
RBC: 5.29 x10E6/uL (ref 4.14–5.80)
RDW: 11.6 % (ref 11.6–15.4)
WBC: 4.8 10*3/uL (ref 3.4–10.8)

## 2021-02-16 LAB — LIPID PANEL WITH LDL/HDL RATIO
Cholesterol, Total: 146 mg/dL (ref 100–199)
HDL: 51 mg/dL (ref >39)
LDL Chol Calc (NIH): 82 mg/dL (ref 0–99)
LDL/HDL Ratio: 1.6 ratio (ref 0.0–3.6)
Triglycerides: 63 mg/dL (ref 0–149)
VLDL Cholesterol Cal: 13 mg/dL (ref 5–40)

## 2021-02-16 LAB — CMP14+EGFR
ALT: 22 IU/L (ref 0–44)
AST: 25 IU/L (ref 0–40)
Albumin/Globulin Ratio: 1.4 (ref 1.2–2.2)
Albumin: 4.3 g/dL (ref 3.8–4.9)
Alkaline Phosphatase: 93 IU/L (ref 44–121)
BUN/Creatinine Ratio: 17 (ref 9–20)
BUN: 22 mg/dL (ref 6–24)
Bilirubin Total: 0.6 mg/dL (ref 0.0–1.2)
CO2: 24 mmol/L (ref 20–29)
Calcium: 9.8 mg/dL (ref 8.7–10.2)
Chloride: 100 mmol/L (ref 96–106)
Creatinine, Ser: 1.27 mg/dL (ref 0.76–1.27)
Globulin, Total: 3.1 g/dL (ref 1.5–4.5)
Glucose: 88 mg/dL (ref 65–99)
Potassium: 3.9 mmol/L (ref 3.5–5.2)
Sodium: 139 mmol/L (ref 134–144)
Total Protein: 7.4 g/dL (ref 6.0–8.5)
eGFR: 65 mL/min/{1.73_m2} (ref >59)

## 2021-02-16 LAB — HIV ANTIBODY (ROUTINE TESTING W REFLEX): HIV Screen 4th Generation wRfx: NONREACTIVE

## 2021-02-16 LAB — PSA: Prostate Specific Ag, Serum: 2.5 ng/mL (ref 0.0–4.0)

## 2021-02-16 LAB — HEPATITIS C ANTIBODY: Hep C Virus Ab: 0.2 s/co ratio (ref 0.0–0.9)

## 2021-02-16 NOTE — Progress Notes (Signed)
Labs are great

## 2021-03-10 ENCOUNTER — Other Ambulatory Visit: Payer: Self-pay

## 2021-03-10 ENCOUNTER — Encounter: Payer: Self-pay | Admitting: Nurse Practitioner

## 2021-03-10 ENCOUNTER — Ambulatory Visit (INDEPENDENT_AMBULATORY_CARE_PROVIDER_SITE_OTHER): Payer: Medicaid Other | Admitting: Nurse Practitioner

## 2021-03-10 DIAGNOSIS — F17200 Nicotine dependence, unspecified, uncomplicated: Secondary | ICD-10-CM | POA: Diagnosis not present

## 2021-03-10 DIAGNOSIS — Z0000 Encounter for general adult medical examination without abnormal findings: Secondary | ICD-10-CM

## 2021-03-10 DIAGNOSIS — E785 Hyperlipidemia, unspecified: Secondary | ICD-10-CM

## 2021-03-10 DIAGNOSIS — Z1211 Encounter for screening for malignant neoplasm of colon: Secondary | ICD-10-CM | POA: Diagnosis not present

## 2021-03-10 DIAGNOSIS — I10 Essential (primary) hypertension: Secondary | ICD-10-CM

## 2021-03-10 MED ORDER — AMLODIPINE BESYLATE 10 MG PO TABS: 10.0000 mg | ORAL_TABLET | Freq: Every morning | ORAL | 3 refills | Status: DC

## 2021-03-10 NOTE — Assessment & Plan Note (Signed)
-  he has some interest in cutting back on nicotine use -recommend patch 21 mg daily with 2 mg nicotine gum PRN for cravings -reduce patch to 14 mg after 1 month, then 7 mg for 1 month; then stop the patch

## 2021-03-10 NOTE — Assessment & Plan Note (Signed)
Lab Results  Component Value Date   CHOL 146 02/15/2021   HDL 51 02/15/2021   LDLCALC 82 02/15/2021   TRIG 63 02/15/2021   CHOLHDL 5.3 Ratio 10/12/2006   -continue pravastatin

## 2021-03-10 NOTE — Assessment & Plan Note (Signed)
-  manual BP rechecked after completing PE was 164/92 (had smoked prior to walking into office) -INCREASE amlodipine to 10 mg

## 2021-03-10 NOTE — Progress Notes (Signed)
Established Patient Office Visit  Subjective:  Patient ID: Antonio Small, male    DOB: 1960/10/24  Age: 60 y.o. MRN: [id number]  CC:  Chief Complaint  Patient presents with   Annual Exam    CPE    HPI MYRL LAZARUS presents for physical exam.  He has HTN, HLD, and nicotine dependence. He states he smoked just prior to walking into the office. No acute issues or adverse med effects.  Past Medical History:  Diagnosis Date   Chronic back pain    Hyperlipidemia    Hypertension     Past Surgical History:  Procedure Laterality Date   BACK SURGERY     HERNIA REPAIR Left    INGUINAL HERNIA REPAIR Right 05/13/2020   Procedure: RIGHT INGUINAL HERNIA REPAIR WITH MESH;  Surgeon: Coralie Keens, MD;  Location: [place] [facility];  Service: General;  Laterality: Right;   INSERTION OF MESH Right 05/13/2020   Procedure: INSERTION OF MESH;  Surgeon: Coralie Keens, MD;  Location: [facility];  Service: General;  Laterality: Right;    Family History  Problem Relation Age of Onset   Hypertension Mother    Hypertension Father    Hypertension Sister     Social History   Socioeconomic History   Marital status: Legally Separated    Spouse name: Not on file   Number of children: 1   Years of education: Not on file   Highest education level: Not on file  Occupational History   Occupation: Disabled    Comment: d/t back pain  Tobacco Use   Smoking status: Every Day    Packs/day: 0.50    Years: 40.00    Pack years: 20.00    Types: Cigarettes   Smokeless tobacco: Never  Vaping Use   Vaping Use: Never used  Substance and Sexual Activity   Alcohol use: Not Currently    Comment: occassionally   Drug use: No   Sexual activity: Yes  Other Topics Concern   Not on file  Social History Narrative   1 daughter   Social Determinants of Health   Financial Resource Strain: Not on file  Food Insecurity: Not on file  Transportation Needs: Not on  file  Physical Activity: Not on file  Stress: Not on file  Social Connections: Not on file  Intimate Partner Violence: Not on file    Outpatient Medications Prior to Visit  Medication Sig Dispense Refill   aspirin EC 81 MG tablet Take 81 mg by mouth every morning.     lisinopril-hydrochlorothiazide (ZESTORETIC) 20-25 MG tablet Take 1 tablet by mouth daily. 90 tablet 3   pravastatin (PRAVACHOL) 40 MG tablet Take 1 tablet (40 mg total) by mouth every morning. 90 tablet 3   amLODipine (NORVASC) 5 MG tablet Take 1 tablet (5 mg total) by mouth every morning. 90 tablet 3   No facility-administered medications prior to visit.    No Known Allergies  ROS Review of Systems    Objective:    Physical Exam  BP (!) 170/105 (BP Location: Left Arm, Patient Position: Sitting, Cuff Size: Large)   Pulse 86   Ht _0  (1.727 m)   Wt 176 lb (79.8 kg)   SpO2 95%   BMI 26.76 kg/m  Wt Readings from Last 3 Encounters:  03/10/21 176 lb (79.8 kg)  02/15/21 175 lb (79.4 kg)  01/25/21 177 lb (80.3 kg)     Health Maintenance Due  Topic Date Due  COLONOSCOPY (Pts 45-21yr Insurance coverage will need to be confirmed)  Never done   INFLUENZA VACCINE  03/07/2021    There are no preventive care reminders to display for this patient.  No results found for: TSH Lab Results  Component Value Date   WBC 4.8 02/15/2021   HGB 16.6 02/15/2021   HCT 49.4 02/15/2021   MCV 93 02/15/2021   PLT 226 02/15/2021   Lab Results  Component Value Date   NA 139 02/15/2021   K 3.9 02/15/2021   CO2 24 02/15/2021   GLUCOSE 88 02/15/2021   BUN 22 02/15/2021   CREATININE 1.27 02/15/2021   BILITOT 0.6 02/15/2021   ALKPHOS 93 02/15/2021   AST 25 02/15/2021   ALT 22 02/15/2021   PROT 7.4 02/15/2021   ALBUMIN 4.3 02/15/2021   CALCIUM 9.8 02/15/2021   ANIONGAP 9 05/12/2020   EGFR 65 02/15/2021   Lab Results  Component Value Date   CHOL 146 02/15/2021   Lab Results  Component Value Date   HDL 51  02/15/2021   Lab Results  Component Value Date   LDLCALC 82 02/15/2021   Lab Results  Component Value Date   TRIG 63 02/15/2021   Lab Results  Component Value Date   CHOLHDL 5.3 Ratio 10/12/2006   No results found for: HGBA1C    Assessment & Plan:   Problem List Items Addressed This Visit       Cardiovascular and Mediastinum   Hypertension, essential    -manual BP rechecked after completing PE was 164/92 (had smoked prior to walking into office) -INCREASE amlodipine to 10 mg       Relevant Medications   amLODipine (NORVASC) 10 MG tablet   Other Relevant Orders   CBC with Differential/Platelet   CMP14+EGFR   Lipid Panel With LDL/HDL Ratio     Other   HLD (hyperlipidemia)    Lab Results  Component Value Date   CHOL 146 02/15/2021   HDL 51 02/15/2021   LDLCALC 82 02/15/2021   TRIG 63 02/15/2021   CHOLHDL 5.3 Ratio 10/12/2006  -continue pravastatin      Relevant Medications   amLODipine (NORVASC) 10 MG tablet   Other Relevant Orders   CBC with Differential/Platelet   CMP14+EGFR   Lipid Panel With LDL/HDL Ratio   TOBACCO ABUSE    -he has some interest in cutting back on nicotine use -recommend patch 21 mg daily with 2 mg nicotine gum PRN for cravings -reduce patch to 14 mg after 1 month, then 7 mg for 1 month; then stop the patch       Other Visit Diagnoses     Colon cancer screening       Relevant Orders   Cologuard       Meds ordered this encounter  Medications   amLODipine (NORVASC) 10 MG tablet    Sig: Take 1 tablet (10 mg total) by mouth every morning.    Dispense:  90 tablet    Refill:  3    Follow-up: Return in about 6 weeks (around 04/21/2021) for BP check.    JNoreene Larsson NP

## 2021-03-10 NOTE — Patient Instructions (Addendum)
For smoking cessation, I recommend using the 12 mg nicotine patch daily with 2 mg nicotine gum PRN for cravings -reduce patch to 14 mg after 1 month, then 7 mg for 1 month; then stop the patch -you can continue to use gum for cravings for up to a month after stopping the patch   We will recheck BP in 6 weeks.  We will recheck labs in 6 months. Please have fasting labs drawn 2-3 days prior to your lab appointment so we can discuss the results during your office visit.

## 2021-03-24 LAB — COLOGUARD

## 2021-03-24 NOTE — Progress Notes (Signed)
An empty collection kit was received in the laboratory. The patient will be contacted to initiate a new sample collection.   He has to put a sample in it to complete the test.

## 2021-04-21 ENCOUNTER — Encounter: Payer: Self-pay | Admitting: Nurse Practitioner

## 2021-04-21 ENCOUNTER — Ambulatory Visit: Payer: Medicaid Other | Admitting: Nurse Practitioner

## 2021-04-21 ENCOUNTER — Other Ambulatory Visit: Payer: Self-pay

## 2021-04-21 VITALS — BP 155/103 | HR 84 | Temp 98.1°F | Ht 68.0 in | Wt 175.0 lb

## 2021-04-21 DIAGNOSIS — I10 Essential (primary) hypertension: Secondary | ICD-10-CM

## 2021-04-21 DIAGNOSIS — Z23 Encounter for immunization: Secondary | ICD-10-CM | POA: Diagnosis not present

## 2021-04-21 MED ORDER — COMFORT TOUCH BP CUFF/MEDIUM MISC: 1.0000 | Freq: Every day | 0 refills | Status: DC

## 2021-04-21 NOTE — Patient Instructions (Addendum)
Please have fasting labs drawn 2-3 days prior to your appointment so we can discuss the results during your office visit.  Flu shot administered today.

## 2021-04-21 NOTE — Assessment & Plan Note (Signed)
BP Readings from Last 3 Encounters:  04/21/21 (!) 155/103  03/10/21 (!) 170/105  02/15/21 (!) 162/97   -BP elevated today, but he states he gets nervous at doctor's office -he isn't checking his BP at home -last cigarette was at 0600 -Rx. BP cuff; continue current meds -may consider adding hydralazine, could increase lisinopril HCTZ -will check labs prior to next visit

## 2021-04-21 NOTE — Addendum Note (Signed)
Addended by: Bjorn Pippin on: 04/21/2021 11:22 AM   Modules accepted: Orders

## 2021-04-21 NOTE — Assessment & Plan Note (Signed)
-   flu shot today

## 2021-04-21 NOTE — Progress Notes (Addendum)
Acute Office Visit  Subjective:    Patient ID: Antonio Small, male    DOB: [birth year], 60 y.o.   MRN: [id number]  Chief Complaint  Patient presents with   Hypertension    Follow up    Hypertension  Patient is in today for BP check. At his last OV, we increased his amlodipine to 10 mg after he had BP 164/92.  We also recommended nicotine replacement therapy to help him quit smoking. He is still smoking, and states his last cigarette was at 0600.  Past Medical History:  Diagnosis Date   Chronic back pain    Hyperlipidemia    Hypertension     Past Surgical History:  Procedure Laterality Date   BACK SURGERY     HERNIA REPAIR Left    INGUINAL HERNIA REPAIR Right 05/13/2020   Procedure: RIGHT INGUINAL HERNIA REPAIR WITH MESH;  Surgeon: Coralie Keens, MD;  Location: [place] [facility];  Service: General;  Laterality: Right;   INSERTION OF MESH Right 05/13/2020   Procedure: INSERTION OF MESH;  Surgeon: Coralie Keens, MD;  Location: [facility];  Service: General;  Laterality: Right;    Family History  Problem Relation Age of Onset   Hypertension Mother    Hypertension Father    Hypertension Sister     Social History   Socioeconomic History   Marital status: Legally Separated    Spouse name: Not on file   Number of children: 1   Years of education: Not on file   Highest education level: Not on file  Occupational History   Occupation: Disabled    Comment: d/t back pain  Tobacco Use   Smoking status: Every Day    Packs/day: 0.50    Years: 40.00    Pack years: 20.00    Types: Cigarettes   Smokeless tobacco: Never  Vaping Use   Vaping Use: Never used  Substance and Sexual Activity   Alcohol use: Not Currently    Comment: occassionally   Drug use: No   Sexual activity: Yes  Other Topics Concern   Not on file  Social History Narrative   1 daughter   Social Determinants of Health   Financial Resource Strain: Not on file   Food Insecurity: Not on file  Transportation Needs: Not on file  Physical Activity: Not on file  Stress: Not on file  Social Connections: Not on file  Intimate Partner Violence: Not on file    Outpatient Medications Prior to Visit  Medication Sig Dispense Refill   amLODipine (NORVASC) 10 MG tablet Take 1 tablet (10 mg total) by mouth every morning. 90 tablet 3   aspirin EC 81 MG tablet Take 81 mg by mouth every morning.     lisinopril-hydrochlorothiazide (ZESTORETIC) 20-25 MG tablet Take 1 tablet by mouth daily. 90 tablet 3   pravastatin (PRAVACHOL) 40 MG tablet Take 1 tablet (40 mg total) by mouth every morning. 90 tablet 3   tamsulosin (FLOMAX) 0.4 MG CAPS capsule Take 0.4 mg by mouth daily.     No facility-administered medications prior to visit.    No Known Allergies  Review of Systems  Constitutional: Negative.   Respiratory: Negative.    Cardiovascular: Negative.   Musculoskeletal: Negative.   Psychiatric/Behavioral: Negative.         He states he may get nervous when he comes to Dr's office      Objective:    Physical Exam Constitutional:      Appearance: Normal  appearance.  Cardiovascular:     Rate and Rhythm: Normal rate and regular rhythm.     Pulses: Normal pulses.     Heart sounds: Normal heart sounds.  Pulmonary:     Effort: Pulmonary effort is normal.     Breath sounds: Normal breath sounds.  Musculoskeletal:        General: Normal range of motion.  Neurological:     Mental Status: He is alert.  Psychiatric:        Mood and Affect: Mood normal.        Behavior: Behavior normal.        Thought Content: Thought content normal.        Judgment: Judgment normal.    BP (!) 155/103 (BP Location: Left Arm, Patient Position: Sitting, Cuff Size: Normal)   Pulse 84   Temp 98.1 F (36.7 C) (Oral)   Ht 5' 8"  (1.727 m)   Wt 175 lb 0.6 oz (79.4 kg)   SpO2 96%   BMI 26.61 kg/m  Wt Readings from Last 3 Encounters:  04/21/21 175 lb 0.6 oz (79.4 kg)   03/10/21 176 lb (79.8 kg)  02/15/21 175 lb (79.4 kg)    There are no preventive care reminders to display for this patient.   There are no preventive care reminders to display for this patient.   No results found for: TSH Lab Results  Component Value Date   WBC 4.8 02/15/2021   HGB 16.6 02/15/2021   HCT 49.4 02/15/2021   MCV 93 02/15/2021   PLT 226 02/15/2021   Lab Results  Component Value Date   NA 139 02/15/2021   K 3.9 02/15/2021   CO2 24 02/15/2021   GLUCOSE 88 02/15/2021   BUN 22 02/15/2021   CREATININE 1.27 02/15/2021   BILITOT 0.6 02/15/2021   ALKPHOS 93 02/15/2021   AST 25 02/15/2021   ALT 22 02/15/2021   PROT 7.4 02/15/2021   ALBUMIN 4.3 02/15/2021   CALCIUM 9.8 02/15/2021   ANIONGAP 9 05/12/2020   EGFR 65 02/15/2021   Lab Results  Component Value Date   CHOL 146 02/15/2021   Lab Results  Component Value Date   HDL 51 02/15/2021   Lab Results  Component Value Date   LDLCALC 82 02/15/2021   Lab Results  Component Value Date   TRIG 63 02/15/2021   Lab Results  Component Value Date   CHOLHDL 5.3 Ratio 10/12/2006   No results found for: HGBA1C     Assessment & Plan:   Problem List Items Addressed This Visit       Cardiovascular and Mediastinum   Hypertension, essential - Primary    BP Readings from Last 3 Encounters:  04/21/21 (!) 155/103  03/10/21 (!) 170/105  02/15/21 (!) 162/97  -BP elevated today, but he states he gets nervous at doctor's office -he isn't checking his BP at home -last cigarette was at 0600 -Rx. BP cuff; continue current meds -may consider adding hydralazine, could increase lisinopril HCTZ -will check labs prior to next visit      Relevant Medications   Blood Pressure Monitoring (COMFORT TOUCH BP CUFF/MEDIUM) MISC   Other Relevant Orders   CMP14+EGFR   CBC with Differential/Platelet   Lipid Panel With LDL/HDL Ratio     Other   Immunization due    -flu shot today      Other Visit Diagnoses     Need  for immunization against influenza       Relevant Orders  Flu Vaccine QUAD 54moIM (Fluarix, Fluzone & Alfiuria Quad PF) (Completed)        Meds ordered this encounter  Medications   Blood Pressure Monitoring (COMFORT TOUCH BP CUFF/MEDIUM) MISC    Sig: 1 each by Does not apply route daily.    Dispense:  1 each    Refill:  0      JNoreene Larsson NP

## 2021-05-14 LAB — CBC WITH DIFFERENTIAL/PLATELET
Basophils Absolute: 0 10*3/uL (ref 0.0–0.2)
Basos: 1 %
EOS (ABSOLUTE): 0.1 10*3/uL (ref 0.0–0.4)
Eos: 3 %
Hematocrit: 47.2 % (ref 37.5–51.0)
Hemoglobin: 15.6 g/dL (ref 13.0–17.7)
Immature Grans (Abs): 0 10*3/uL (ref 0.0–0.1)
Immature Granulocytes: 0 %
Lymphocytes Absolute: 1.5 10*3/uL (ref 0.7–3.1)
Lymphs: 34 %
MCH: 31.5 pg (ref 26.6–33.0)
MCHC: 33.1 g/dL (ref 31.5–35.7)
MCV: 95 fL (ref 79–97)
Monocytes Absolute: 0.8 10*3/uL (ref 0.1–0.9)
Monocytes: 17 %
Neutrophils Absolute: 2 10*3/uL (ref 1.4–7.0)
Neutrophils: 45 %
Platelets: 184 10*3/uL (ref 150–450)
RBC: 4.96 x10E6/uL (ref 4.14–5.80)
RDW: 11.4 % — ABNORMAL LOW (ref 11.6–15.4)
WBC: 4.4 10*3/uL (ref 3.4–10.8)

## 2021-05-14 LAB — LIPID PANEL WITH LDL/HDL RATIO
Cholesterol, Total: 132 mg/dL (ref 100–199)
HDL: 49 mg/dL (ref >39)
LDL Chol Calc (NIH): 72 mg/dL (ref 0–99)
LDL/HDL Ratio: 1.5 ratio (ref 0.0–3.6)
Triglycerides: 48 mg/dL (ref 0–149)
VLDL Cholesterol Cal: 11 mg/dL (ref 5–40)

## 2021-05-14 LAB — CMP14+EGFR
ALT: 20 IU/L (ref 0–44)
AST: 21 IU/L (ref 0–40)
Albumin/Globulin Ratio: 1.3 (ref 1.2–2.2)
Albumin: 4.3 g/dL (ref 3.8–4.9)
Alkaline Phosphatase: 91 IU/L (ref 44–121)
BUN/Creatinine Ratio: 13 (ref 10–24)
BUN: 13 mg/dL (ref 8–27)
Bilirubin Total: 0.6 mg/dL (ref 0.0–1.2)
CO2: 25 mmol/L (ref 20–29)
Calcium: 9.7 mg/dL (ref 8.6–10.2)
Chloride: 101 mmol/L (ref 96–106)
Creatinine, Ser: 1.04 mg/dL (ref 0.76–1.27)
Globulin, Total: 3.2 g/dL (ref 1.5–4.5)
Glucose: 89 mg/dL (ref 70–99)
Potassium: 3.8 mmol/L (ref 3.5–5.2)
Sodium: 141 mmol/L (ref 134–144)
Total Protein: 7.5 g/dL (ref 6.0–8.5)
eGFR: 82 mL/min/{1.73_m2} (ref >59)

## 2021-05-16 NOTE — Progress Notes (Signed)
Labs look great.

## 2021-05-23 ENCOUNTER — Encounter: Payer: Self-pay | Admitting: Internal Medicine

## 2021-05-23 ENCOUNTER — Encounter (INDEPENDENT_AMBULATORY_CARE_PROVIDER_SITE_OTHER): Payer: Self-pay

## 2021-05-23 ENCOUNTER — Ambulatory Visit (INDEPENDENT_AMBULATORY_CARE_PROVIDER_SITE_OTHER): Payer: Medicaid Other | Admitting: Internal Medicine

## 2021-05-23 ENCOUNTER — Other Ambulatory Visit: Payer: Self-pay

## 2021-05-23 VITALS — BP 140/92 | HR 101 | Temp 98.4°F | Ht 68.0 in | Wt 178.1 lb

## 2021-05-23 DIAGNOSIS — E782 Mixed hyperlipidemia: Secondary | ICD-10-CM | POA: Diagnosis not present

## 2021-05-23 DIAGNOSIS — I10 Essential (primary) hypertension: Secondary | ICD-10-CM

## 2021-05-23 MED ORDER — LISINOPRIL-HYDROCHLOROTHIAZIDE 20-25 MG PO TABS: 1.0000 | ORAL_TABLET | Freq: Every day | ORAL | 1 refills | Status: DC

## 2021-05-23 NOTE — Progress Notes (Signed)
Established Patient Office Visit  Subjective:  Patient ID: Antonio Small, male    DOB: 1961/02/17  Age: 60 y.o. MRN: [id number]  CC:  Chief Complaint  Patient presents with   Follow-up    1 month F/U HTN/ HLD    HPI Antonio Small presents for f/u of HTN and blood tests review.  HTN: His BP was elevated in the office today.  Of note, he has been taking old dose of Lisinopril-HCTZ instead of 20-25 mg. Takes medications regularly. Patient denies headache, dizziness, chest pain, dyspnea or palpitations.  Blood test were reviewed and discussed with the patient in detail.   Past Medical History:  Diagnosis Date   Chronic back pain    Hyperlipidemia    Hypertension     Past Surgical History:  Procedure Laterality Date   BACK SURGERY     HERNIA REPAIR Left    INGUINAL HERNIA REPAIR Right 05/13/2020   Procedure: RIGHT INGUINAL HERNIA REPAIR WITH MESH;  Surgeon: Coralie Keens, MD;  Location: [facility];  Service: General;  Laterality: Right;   INSERTION OF MESH Right 05/13/2020   Procedure: INSERTION OF MESH;  Surgeon: Coralie Keens, MD;  Location: [place];  Service: General;  Laterality: Right;    Family History  Problem Relation Age of Onset   Hypertension Mother    Hypertension Father    Hypertension Sister     Social History   Socioeconomic History   Marital status: Legally Separated    Spouse name: Not on file   Number of children: 1   Years of education: Not on file   Highest education level: Not on file  Occupational History   Occupation: Disabled    Comment: d/t back pain  Tobacco Use   Smoking status: Every Day    Packs/day: 0.50    Years: 40.00    Pack years: 20.00    Types: Cigarettes   Smokeless tobacco: Never  Vaping Use   Vaping Use: Never used  Substance and Sexual Activity   Alcohol use: Not Currently    Comment: occassionally   Drug use: No   Sexual activity: Yes  Other Topics Concern   Not  on file  Social History Narrative   1 daughter   Social Determinants of Health   Financial Resource Strain: Not on file  Food Insecurity: Not on file  Transportation Needs: Not on file  Physical Activity: Not on file  Stress: Not on file  Social Connections: Not on file  Intimate Partner Violence: Not on file    Outpatient Medications Prior to Visit  Medication Sig Dispense Refill   amLODipine (NORVASC) 10 MG tablet Take 1 tablet (10 mg total) by mouth every morning. 90 tablet 3   aspirin EC 81 MG tablet Take 81 mg by mouth every morning.     Blood Pressure Monitoring (COMFORT TOUCH BP CUFF/MEDIUM) MISC 1 each by Does not apply route daily. 1 each 0   pravastatin (PRAVACHOL) 40 MG tablet Take 1 tablet (40 mg total) by mouth every morning. 90 tablet 3   tamsulosin (FLOMAX) 0.4 MG CAPS capsule Take 0.4 mg by mouth daily.     lisinopril-hydrochlorothiazide (ZESTORETIC) 20-25 MG tablet Take 1 tablet by mouth daily. 90 tablet 3   No facility-administered medications prior to visit.    No Known Allergies  ROS Review of Systems  Constitutional:  Negative for chills and fever.  HENT:  Negative for congestion and sore throat.  Eyes:  Negative for pain and discharge.  Respiratory:  Negative for cough and shortness of breath.   Cardiovascular:  Negative for chest pain and palpitations.  Gastrointestinal:  Negative for constipation, diarrhea, nausea and vomiting.  Endocrine: Negative for polydipsia and polyuria.  Genitourinary:  Negative for dysuria and hematuria.  Musculoskeletal:  Negative for neck pain and neck stiffness.  Skin:  Negative for rash.  Neurological:  Negative for dizziness, weakness, numbness and headaches.  Psychiatric/Behavioral:  Negative for agitation and behavioral problems.      Objective:    Physical Exam Vitals reviewed.  Constitutional:      General: He is not in acute distress.    Appearance: He is not diaphoretic.  HENT:     Head: Normocephalic  and atraumatic.     Nose: Nose normal.     Mouth/Throat:     Mouth: Mucous membranes are moist.  Eyes:     General: No scleral icterus.    Extraocular Movements: Extraocular movements intact.  Cardiovascular:     Rate and Rhythm: Normal rate and regular rhythm.     Pulses: Normal pulses.     Heart sounds: Normal heart sounds. No murmur heard. Pulmonary:     Breath sounds: Normal breath sounds. No wheezing or rales.  Musculoskeletal:     Cervical back: Neck supple. No tenderness.     Right lower leg: No edema.     Left lower leg: No edema.  Skin:    General: Skin is warm.     Findings: No rash.  Neurological:     General: No focal deficit present.     Mental Status: He is alert and oriented to person, place, and time.     Sensory: No sensory deficit.     Motor: No weakness.  Psychiatric:        Mood and Affect: Mood normal.        Behavior: Behavior normal.    BP (!) 140/92 (BP Location: Left Arm, Cuff Size: Normal)   Pulse (!) 101   Temp 98.4 F (36.9 C) (Oral)   Ht 5' 8"  (1.727 m)   Wt 178 lb 1.9 oz (80.8 kg)   SpO2 97%   BMI 27.08 kg/m  Wt Readings from Last 3 Encounters:  05/23/21 178 lb 1.9 oz (80.8 kg)  04/21/21 175 lb 0.6 oz (79.4 kg)  03/10/21 176 lb (79.8 kg)     Health Maintenance Due  Topic Date Due   Zoster Vaccines- Shingrix (1 of 2) Never done   COVID-19 Vaccine (3 - Booster for Moderna series) 04/20/2020    There are no preventive care reminders to display for this patient.  No results found for: TSH Lab Results  Component Value Date   WBC 4.4 05/13/2021   HGB 15.6 05/13/2021   HCT 47.2 05/13/2021   MCV 95 05/13/2021   PLT 184 05/13/2021   Lab Results  Component Value Date   NA 141 05/13/2021   K 3.8 05/13/2021   CO2 25 05/13/2021   GLUCOSE 89 05/13/2021   BUN 13 05/13/2021   CREATININE 1.04 05/13/2021   BILITOT 0.6 05/13/2021   ALKPHOS 91 05/13/2021   AST 21 05/13/2021   ALT 20 05/13/2021   PROT 7.5 05/13/2021   ALBUMIN 4.3  05/13/2021   CALCIUM 9.7 05/13/2021   ANIONGAP 9 05/12/2020   EGFR 82 05/13/2021   Lab Results  Component Value Date   CHOL 132 05/13/2021   Lab Results  Component Value Date  HDL 49 05/13/2021   Lab Results  Component Value Date   LDLCALC 72 05/13/2021   Lab Results  Component Value Date   TRIG 48 05/13/2021   Lab Results  Component Value Date   CHOLHDL 5.3 Ratio 10/12/2006   No results found for: HGBA1C    Assessment & Plan:   Problem List Items Addressed This Visit       Cardiovascular and Mediastinum   Hypertension, essential - Primary    BP Readings from Last 1 Encounters:  05/23/21 (!) 140/92  uncontrolled due to unawareness of medication dose change Resent Lisinopril-HCTZ 20-25 mg instead of 20-12.5 mg Counseled for compliance with the medications Advised DASH diet and moderate exercise/walking, at least 150 mins/week       Relevant Medications   lisinopril-hydrochlorothiazide (ZESTORETIC) 20-25 MG tablet     Other   HLD (hyperlipidemia)    Lipid profile reviewed Continue pravastatin      Relevant Medications   lisinopril-hydrochlorothiazide (ZESTORETIC) 20-25 MG tablet    Meds ordered this encounter  Medications   lisinopril-hydrochlorothiazide (ZESTORETIC) 20-25 MG tablet    Sig: Take 1 tablet by mouth daily.    Dispense:  90 tablet    Refill:  1    Follow-up: Return in about 4 months (around 09/23/2021) for HTN and HLD.    Lindell Spar, MD

## 2021-05-23 NOTE — Assessment & Plan Note (Signed)
BP Readings from Last 1 Encounters:  05/23/21 (!) 140/92   uncontrolled due to unawareness of medication dose change Resent Lisinopril-HCTZ 20-25 mg instead of 20-12.5 mg Counseled for compliance with the medications Advised DASH diet and moderate exercise/walking, at least 150 mins/week

## 2021-05-23 NOTE — Assessment & Plan Note (Signed)
Lipid profile reviewed Continue pravastatin

## 2021-05-23 NOTE — Patient Instructions (Signed)
Please start taking Lisinopril-HCTZ 20-25 mg.  Continue to take other medications as prescribed.

## 2021-09-14 ENCOUNTER — Other Ambulatory Visit: Payer: Self-pay | Admitting: Nurse Practitioner

## 2021-09-14 MED ORDER — TAMSULOSIN HCL 0.4 MG PO CAPS: 0.4000 mg | ORAL_CAPSULE | Freq: Every day | ORAL | 1 refills | Status: DC

## 2021-09-26 ENCOUNTER — Ambulatory Visit: Payer: Medicaid Other | Admitting: Nurse Practitioner

## 2021-09-26 ENCOUNTER — Other Ambulatory Visit: Payer: Self-pay

## 2021-09-26 ENCOUNTER — Encounter: Payer: Self-pay | Admitting: Nurse Practitioner

## 2021-09-26 VITALS — BP 138/90 | HR 93 | Ht 68.0 in | Wt 173.0 lb

## 2021-09-26 DIAGNOSIS — Z0001 Encounter for general adult medical examination with abnormal findings: Secondary | ICD-10-CM | POA: Insufficient documentation

## 2021-09-26 DIAGNOSIS — Z1211 Encounter for screening for malignant neoplasm of colon: Secondary | ICD-10-CM

## 2021-09-26 DIAGNOSIS — I10 Essential (primary) hypertension: Secondary | ICD-10-CM

## 2021-09-26 DIAGNOSIS — F17200 Nicotine dependence, unspecified, uncomplicated: Secondary | ICD-10-CM

## 2021-09-26 DIAGNOSIS — E782 Mixed hyperlipidemia: Secondary | ICD-10-CM

## 2021-09-26 MED ORDER — LISINOPRIL-HYDROCHLOROTHIAZIDE 20-25 MG PO TABS: 1.0000 | ORAL_TABLET | Freq: Every day | ORAL | 1 refills | Status: DC

## 2021-09-26 MED ORDER — COMFORT TOUCH BP CUFF/MEDIUM MISC: 1.0000 | Freq: Every day | 0 refills | Status: DC

## 2021-09-26 NOTE — Assessment & Plan Note (Signed)
Patient refused referral for colonoscopy. Cologuard discussed with the patient, patient agreed to do Cologuard test, patient told to make sure he returns the box with a [employer], he verbalized understanding.

## 2021-09-26 NOTE — Assessment & Plan Note (Signed)
BP Readings from Last 3 Encounters:  09/26/21 (!) 152/96  05/23/21 (!) 140/92  04/21/21 (!) 155/103  uncontrolled, pt has been taking lisinopril-HCTZ 20-12.15m daily instead of the new RX  lisinopril-HCTZ 20-25. He also take amlodipine 172mdaily  Pt told to start taking   lisinopril-HCTZ 20-25 and  amlodipine 1025maily Resent   lisinopril-HCTZ 20-25 to the pharmacy  CMP+EGFR today Pt educated on the need to take medication daily he verbalized understanding Pt counseled on DASH diet, engage in regular exercise 30 minutes 5 times a week.

## 2021-09-26 NOTE — Patient Instructions (Addendum)
Pleas get your fasting labs done  tomorrow.  Pleas get your TDAP vaccine at your pharmacy. Take lisinopril-HCTZ 20-25mg  daily , continue amlodipine 10 mg daily  It is important that you exercise regularly at least 30 minutes 5 times a week.  Think about what you will eat, plan ahead. Choose " clean, green, fresh or frozen" over canned, processed or packaged foods which are more sugary, salty and fatty. 70 to 75% of food eaten should be vegetables and fruit. Three meals at set times with snacks allowed between meals, but they must be fruit or vegetables. Aim to eat over a 12 hour period , example 7 am to 7 pm, and STOP after  your last meal of the day. Drink water,generally about 64 ounces per day, no other drink is as healthy. Fruit juice is best enjoyed in a healthy way, by EATING the fruit.  Thanks for choosing [facility], we consider it a privelige to serve you.

## 2021-09-26 NOTE — Assessment & Plan Note (Signed)
Lab Results  Component Value Date   CHOL 132 05/13/2021   HDL 49 05/13/2021   LDLCALC 72 05/13/2021   TRIG 48 05/13/2021   CHOLHDL 5.3 Ratio 10/12/2006  takes pravastatin 40mg  daily Pt will get lipid panel today and adjust meds if needed.  Continue pravastatin 40mg  daily

## 2021-09-26 NOTE — Assessment & Plan Note (Signed)
Pt continue to smoke, smokes 5 cigarettes a day, states he has been smoking for about 35, quit for 9 years and started again, he will work on cutting back , he stated that he is not ready to quit today, risk of developing lung cancer , copd discussed with pt he verbalized understanding.

## 2021-09-26 NOTE — Progress Notes (Signed)
° °  Antonio Small     MRN: [id number]      DOB: 11-26-1960   HPI Mr. Thoman is here for follow up and re-evaluation of chronic medical conditions, medication management and review of any available recent lab and radiology data.  Preventive health is updated, specifically  Cancer screening and Immunization.   Questions or concerns regarding consultations or procedures which the PT has had in the interim are  addressed. The PT denies any adverse reactions to current medications since the last visit.  There are no new concerns.  There are no specific complaints   Patient has been taking amlodipine 10 mg daily, lisinopril hydrochlorothiazide 20-12.5 mg daily, RX for lisinopril hydrochlorothiazide 20-25 mg was sent to patient's pharmacy at his last visit, but patient has been taking the 20-12.5mg  dose.   Pt continue to smoke, smokes 5 cigarettes a day, states he has been smoking for about 35, quit for 9 years and started again, he will work on cutting back .   Pt refused TDAP, COVID  and shingles vaccines today , need for both vaccines discussed with pt he verbalised understanding. HE stated that is enough I am ok taking my flu vaccines.   Cologuard ordered today    ROS Denies recent fever or chills. Denies sinus pressure, nasal congestion, ear pain or sore throat. Denies chest congestion, productive cough or wheezing. Denies chest pains, palpitations and leg swelling Denies abdominal pain, nausea, vomiting,diarrhea or constipation.   Denies dysuria, frequency, hesitancy or incontinence. Denies joint pain, swelling and limitation in mobility. Denies depression, anxiety or insomnia.    PE  BP (!) 152/96    Pulse 93    Ht 5\' 8"  (1.727 m)    Wt 173 lb 0.6 oz (78.5 kg)    SpO2 94%    BMI 26.31 kg/m   Patient alert and oriented and in no cardiopulmonary distress.   Chest: Clear to auscultation bilaterally.  CVS: S1, S2 no murmurs, no S3.Regular rate.  ABD: Soft non tender.    Ext: No edema  MS: Adequate ROM spine, shoulders, hips and knees.  Skin: Intact, no ulcerations or rash noted.  Psych: Good eye contact, normal affect. Memory intact not anxious or depressed appearing.    Assessment & Plan

## 2021-09-28 LAB — LIPID PANEL
Chol/HDL Ratio: 2.4 ratio (ref 0.0–5.0)
Cholesterol, Total: 131 mg/dL (ref 100–199)
HDL: 54 mg/dL (ref >39)
LDL Chol Calc (NIH): 65 mg/dL (ref 0–99)
Triglycerides: 52 mg/dL (ref 0–149)
VLDL Cholesterol Cal: 12 mg/dL (ref 5–40)

## 2021-09-28 LAB — CMP14+EGFR
ALT: 21 IU/L (ref 0–44)
AST: 23 IU/L (ref 0–40)
Albumin/Globulin Ratio: 1.7 (ref 1.2–2.2)
Albumin: 4.5 g/dL (ref 3.8–4.9)
Alkaline Phosphatase: 89 IU/L (ref 44–121)
BUN/Creatinine Ratio: 8 — ABNORMAL LOW (ref 10–24)
BUN: 11 mg/dL (ref 8–27)
Bilirubin Total: 0.5 mg/dL (ref 0.0–1.2)
CO2: 24 mmol/L (ref 20–29)
Calcium: 10 mg/dL (ref 8.6–10.2)
Chloride: 102 mmol/L (ref 96–106)
Creatinine, Ser: 1.3 mg/dL — ABNORMAL HIGH (ref 0.76–1.27)
Globulin, Total: 2.7 g/dL (ref 1.5–4.5)
Glucose: 102 mg/dL — ABNORMAL HIGH (ref 70–99)
Potassium: 3.8 mmol/L (ref 3.5–5.2)
Sodium: 138 mmol/L (ref 134–144)
Total Protein: 7.2 g/dL (ref 6.0–8.5)
eGFR: 63 mL/min/{1.73_m2} (ref >59)

## 2021-09-28 NOTE — Progress Notes (Signed)
Creatinine level is slightly elevated, drink plenty of water to stay hydrated Patient should continue amlodipine 10 mg daily lisinopril hydrochlorothiazide 20-25 mg tablets daily. We will recheck labs at his next visit Cholesterol level is normal

## 2021-09-29 ENCOUNTER — Other Ambulatory Visit: Payer: Self-pay | Admitting: Nurse Practitioner

## 2021-09-29 DIAGNOSIS — Z1211 Encounter for screening for malignant neoplasm of colon: Secondary | ICD-10-CM

## 2021-10-27 ENCOUNTER — Encounter: Payer: Self-pay | Admitting: Nurse Practitioner

## 2021-10-27 ENCOUNTER — Ambulatory Visit: Payer: Medicaid Other | Admitting: Nurse Practitioner

## 2021-10-27 ENCOUNTER — Other Ambulatory Visit: Payer: Self-pay

## 2021-10-27 VITALS — BP 140/90 | HR 93 | Ht 68.0 in | Wt 170.0 lb

## 2021-10-27 DIAGNOSIS — R350 Frequency of micturition: Secondary | ICD-10-CM | POA: Diagnosis not present

## 2021-10-27 DIAGNOSIS — Z2821 Immunization not carried out because of patient refusal: Secondary | ICD-10-CM

## 2021-10-27 DIAGNOSIS — I10 Essential (primary) hypertension: Secondary | ICD-10-CM

## 2021-10-27 MED ORDER — AMLODIPINE BESYLATE 10 MG PO TABS: 10.0000 mg | ORAL_TABLET | Freq: Every morning | ORAL | 3 refills | Status: DC

## 2021-10-27 MED ORDER — TAMSULOSIN HCL 0.4 MG PO CAPS: 0.4000 mg | ORAL_CAPSULE | Freq: Every day | ORAL | 0 refills | Status: DC

## 2021-10-27 NOTE — Progress Notes (Signed)
? ?  Antonio Small     MRN: [id number]      DOB: Jul 24, 1961 ? ? ?HPI ?Antonio Small with past medical history of essential hypertension, hyperlipidemia, tobacco abuse is here for follow up for hypertension. He ran out amlodipine and tamusulosin a week ago, stated that he had been taking all medications as prescribed before running out of meds.  Patient has not been checking blood pressure at home pt denies CP, dizziness, HA, edema.  ? ? ?Taking Flomax.4mg  daily. patient states that ''I pee all the time'', he does not know why he was placed on Flomax denies hesitancy diagnosis of BPH, dysuria, Medication discontinued today will reevaluate and if needed put patient back on medication patient told not to take Flomax.  ? ?The PT denies any adverse reactions to current medications since the last visit.  ? ? ?ROS ?Denies recent fever or chills. ?Denies sinus pressure, nasal congestion, ear pain or sore throat. ?Denies chest congestion, productive cough or wheezing. ?Denies chest pains, palpitations and leg swelling ?Denies abdominal pain, nausea, vomiting,diarrhea or constipation.   ?Denies dysuria,  hesitancy or incontinence. ?Denies joint pain, swelling and limitation in mobility. ?Denies depression, anxiety or insomnia. ?` ? ? ?PE ? ?BP (!) 142/82 (BP Location: Right Arm, Cuff Size: Large)   Pulse 93   Ht 5\' 8"  (1.727 m)   Wt 170 lb (77.1 kg)   SpO2 93%   BMI 25.85 kg/m?  ? ?Patient alert and oriented and in no cardiopulmonary distress. ? ?Chest: Clear to auscultation bilaterally. ? ?CVS: S1, S2 no murmurs, no S3.Regular rate. ? ?ABD: Soft non tender.  ? ?Ext: No edema ? ?MS: Adequate ROM spine, shoulders, hips and knees. ? ?Psych: Good eye contact, normal affect. Memory intact not anxious or depressed appearing. ? ? ?Assessment & Plan ? ?Hypertension, essential ?BP Readings from Last 3 Encounters:  ?10/27/21 (!) 142/82  ?09/26/21 138/90  ?05/23/21 (!) 140/92  ?takes  amlodipine 10mg  daily, lisinopril-HCTZ 20/25mg   daily.  ?Ran out of amlodipine a week ago, medication refilled today.  ?Continue amlodipine 10mg  daily, lisinopril-HCTZ 20/25mg  daily ?Dash diet advised, engage in regular exercise.  ?Bmp today.  ? ? ?Urinary frequency ?Takes Flomax 0.4 mg daily ?Does not know why he is taking medication, denies prior diagnosis of BPH, states that I pee all the time ?We will check A1c today, ?Patient is on hydrochlorothiazide ?Discontinue Flomax ?Will reassess need for Flomax in the future. ? ?Immunization refused ?Need for vaccine discussed with pt, he verbalized understanding.   ?

## 2021-10-27 NOTE — Patient Instructions (Signed)

## 2021-10-27 NOTE — Assessment & Plan Note (Addendum)
BP Readings from Last 3 Encounters:  ?10/27/21 (!) 142/82  ?09/26/21 138/90  ?05/23/21 (!) 140/92  ?takes  amlodipine 10mg  daily, lisinopril-HCTZ 20/25mg  daily.  ?Ran out of amlodipine a week ago, medication refilled today.  ?Continue amlodipine 10mg  daily, lisinopril-HCTZ 20/25mg  daily ?Dash diet advised, engage in regular exercise.  ?Bmp today.  ? ?

## 2021-10-27 NOTE — Assessment & Plan Note (Signed)
Takes Flomax 0.4 mg daily ?Does not know why he is taking medication, denies prior diagnosis of BPH, states that I pee all the time ?We will check A1c today, ?Patient is on hydrochlorothiazide ?Discontinue Flomax ?Will reassess need for Flomax in the future. ?

## 2021-10-27 NOTE — Assessment & Plan Note (Signed)
Need for vaccine discussed with pt, he verbalized understanding.  ?

## 2021-10-28 ENCOUNTER — Other Ambulatory Visit: Payer: Self-pay | Admitting: Nurse Practitioner

## 2021-10-28 LAB — BASIC METABOLIC PANEL
BUN/Creatinine Ratio: 13 (ref 10–24)
BUN: 14 mg/dL (ref 8–27)
CO2: 31 mmol/L — ABNORMAL HIGH (ref 20–29)
Calcium: 10.6 mg/dL — ABNORMAL HIGH (ref 8.6–10.2)
Chloride: 103 mmol/L (ref 96–106)
Creatinine, Ser: 1.07 mg/dL (ref 0.76–1.27)
Glucose: 74 mg/dL (ref 70–99)
Potassium: 4 mmol/L (ref 3.5–5.2)
Sodium: 144 mmol/L (ref 134–144)
eGFR: 79 mL/min/{1.73_m2} (ref >59)

## 2021-10-28 LAB — HEMOGLOBIN A1C
Est. average glucose Bld gHb Est-mCnc: 103 mg/dL
Hgb A1c MFr Bld: 5.2 % (ref 4.8–5.6)

## 2021-10-28 NOTE — Progress Notes (Signed)
Calcium level is elevated, patient should drink plenty of water stay hydrated we will recheck labs at his next visit patient should also avoid calcium supplements. ?A1c is normal

## 2022-03-07 ENCOUNTER — Encounter: Payer: Self-pay | Admitting: Nurse Practitioner

## 2022-03-07 ENCOUNTER — Ambulatory Visit: Payer: Medicaid Other | Admitting: Nurse Practitioner

## 2022-03-07 VITALS — BP 120/78 | HR 78 | Ht 68.0 in | Wt 170.0 lb

## 2022-03-07 DIAGNOSIS — E785 Hyperlipidemia, unspecified: Secondary | ICD-10-CM

## 2022-03-07 DIAGNOSIS — I10 Essential (primary) hypertension: Secondary | ICD-10-CM

## 2022-03-07 DIAGNOSIS — F321 Major depressive disorder, single episode, moderate: Secondary | ICD-10-CM | POA: Insufficient documentation

## 2022-03-07 DIAGNOSIS — M65341 Trigger finger, right ring finger: Secondary | ICD-10-CM

## 2022-03-07 DIAGNOSIS — G8929 Other chronic pain: Secondary | ICD-10-CM

## 2022-03-07 DIAGNOSIS — E663 Overweight: Secondary | ICD-10-CM

## 2022-03-07 DIAGNOSIS — M5442 Lumbago with sciatica, left side: Secondary | ICD-10-CM

## 2022-03-07 DIAGNOSIS — F17200 Nicotine dependence, unspecified, uncomplicated: Secondary | ICD-10-CM

## 2022-03-07 MED ORDER — PRAVASTATIN SODIUM 40 MG PO TABS: 40.0000 mg | ORAL_TABLET | Freq: Every morning | ORAL | 3 refills | Status: DC

## 2022-03-07 MED ORDER — LISINOPRIL-HYDROCHLOROTHIAZIDE 20-25 MG PO TABS: 1.0000 | ORAL_TABLET | Freq: Every day | ORAL | 1 refills | Status: DC

## 2022-03-07 MED ORDER — AMLODIPINE BESYLATE 10 MG PO TABS: 10.0000 mg | ORAL_TABLET | Freq: Every morning | ORAL | 3 refills | Status: DC

## 2022-03-07 NOTE — Assessment & Plan Note (Addendum)
On disability , does not want medication for his pain .  Patient encouraged to engage in regular daily stretching exercises

## 2022-03-07 NOTE — Assessment & Plan Note (Signed)
Lab Results  Component Value Date   NA 144 10/27/2021   K 4.0 10/27/2021   CO2 31 (H) 10/27/2021   GLUCOSE 74 10/27/2021   BUN 14 10/27/2021   CREATININE 1.07 10/27/2021   CALCIUM 10.6 (H) 10/27/2021   EGFR 79 10/27/2021   GFRNONAA >60 05/12/2020  Patient told to avoid calcium supplements Check BMP today

## 2022-03-07 NOTE — Assessment & Plan Note (Addendum)
Smokes about 1pack every 3 days  Asked about quitting: confirms that he/she currently smokes cigarettes Advise to quit smoking: Educated about QUITTING to reduce the risk of cancer, cardio and cerebrovascular disease. Assess willingness: Unwilling to quit at this time, but is working on cutting back. Assist with counseling and pharmacotherapy: Counseled for 5 minutes and literature provided. Arrange for follow up: follow up in 6 months and continue to offer help.

## 2022-03-07 NOTE — Progress Notes (Signed)
Antonio Small     MRN: [id number]      DOB: June 14, 1961   HPI Antonio Small with past medical history of hypertension, tobacco abuse, hyperlipidemia, chronic low back pain is here for follow up and re-evaluation of chronic medical conditions, medication management.  The Antonio Small denies any adverse reactions to current medications since the last visit.   Antonio Small Complains of stiffness and pain of right hand ringer  finger since the past 3 weeks.  States that every time the finger gets stuck Antonio Small has to apply pressure to be able to bend his fingers currently has aching pain 8/10 Antonio Small denies numbness, tingling.    Chronic low back pain. States that Antonio Small had surgery for ruptured disc, has intermittent  low back pain, Antonio Small does not want to take any medication for his pain . , leg leg sometimes feels numb, sometime his left hip tingles. , Antonio Small denies  urinary incontinence, fever, chills. Wears back brace as needed.   Refused shingles vaccine and TDAP .  For both vaccines discussed with Antonio Small Antonio Small encouraged to get the vaccines at his pharmacy   Depression . Pain in his finger and back has been making him depressed.  Has SI, HI.  Refused treatment for his depression   ROS Denies recent fever or chills. Denies sinus pressure, nasal congestion, ear pain or sore throat. Denies chest congestion, productive cough or wheezing. Denies chest pains, palpitations and leg swelling Denies abdominal pain, nausea, vomiting,diarrhea or constipation.   Denies dysuria, frequency, hesitancy or incontinence. Denies headaches, seizures, numbness, or tingling. Denies skin break down or rash.   PE  BP 120/78 (BP Location: Left Arm, Cuff Size: Normal)   Pulse 78   Ht 5' 8"  (1.727 m)   Wt 170 lb (77.1 kg)   SpO2 92%   BMI 25.85 kg/m   Antonio Small alert and oriented and in no cardiopulmonary distress.  Chest: Clear to auscultation bilaterally.  CVS: S1, S2 no murmurs, no S3.Regular rate.  ABD: Soft non tender.    Ext: No edema  MS: Adequate ROM spine, shoulders, hips and knees.tenderness on palpation of right ring finger around the proximal phalangeal area , skin warm and dry able to do ROM of the hand , has a palpable radial pulse. No active triggering noted today, no swelling noted   Skin: Intact, no ulcerations or rash noted.  Psych: Good eye contact, normal affect. Memory intact not anxious or depressed appearing.  CNS: CN 2-12 intact, power,  normal throughout.no focal deficits noted.   Assessment & Plan  Hypertension, essential BP Readings from Last 3 Encounters:  03/07/22 120/78  10/27/21 140/90  09/26/21 138/90  Condition well-controlled on amlodipine 10 mg daily, lisinopril-hydrochlorothiazide 20-25 mg 1 tablet daily Continue current medications DASH diet advised Engage in regular daily exercise with at least 150 minutes weekly Checking BMP today  LOW BACK PAIN On disability , does not want medication for his pain .  Antonio Small encouraged to engage in regular daily stretching exercises  Depression, major, single episode, moderate (HCC) PHQ9 score 10 Denies SI, HI Refused treatment  Will monitor.   TOBACCO ABUSE Smokes about 1pack every 3 days  Asked about quitting: confirms that Antonio Small/she currently smokes cigarettes Advise to quit smoking: Educated about QUITTING to reduce the risk of cancer, cardio and cerebrovascular disease. Assess willingness: Unwilling to quit at this time, but is working on cutting back. Assist with counseling and pharmacotherapy: Counseled for 5 minutes and literature provided. Arrange for follow  up: follow up in 6 months and continue to offer help.   HLD (hyperlipidemia) Lab Results  Component Value Date   CHOL 131 09/27/2021   HDL 54 09/27/2021   LDLCALC 65 09/27/2021   TRIG 52 09/27/2021   CHOLHDL 2.4 09/27/2021  Medical condition well-controlled on pravastatin 40 mg daily Continue current medication Avoid fatty fried foods Check labs at  next visit  Overweight (BMI 25.0-29.9) Antonio Small counseled on low-carb modified diet Encouraged to engage in regular daily walking exercise at least 150 minutes weekly  Hypercalcemia Lab Results  Component Value Date   NA 144 10/27/2021   K 4.0 10/27/2021   CO2 31 (H) 10/27/2021   GLUCOSE 74 10/27/2021   BUN 14 10/27/2021   CREATININE 1.07 10/27/2021   CALCIUM 10.6 (H) 10/27/2021   EGFR 79 10/27/2021   GFRNONAA >60 05/12/2020  Antonio Small told to avoid calcium supplements Check BMP today  Trigger finger, right ring finger ROM Of motion exercises encouraged  Take OTC tylenol as needed, Antonio Small refused prescription medications today

## 2022-03-07 NOTE — Assessment & Plan Note (Addendum)
BP Readings from Last 3 Encounters:  03/07/22 120/78  10/27/21 140/90  09/26/21 138/90  Condition well-controlled on amlodipine 10 mg daily, lisinopril-hydrochlorothiazide 20-25 mg 1 tablet daily Continue current medications DASH diet advised Engage in regular daily exercise with at least 150 minutes weekly Checking BMP today

## 2022-03-07 NOTE — Assessment & Plan Note (Signed)
PHQ9 score 10 Denies SI, HI Refused treatment  Will monitor.

## 2022-03-07 NOTE — Assessment & Plan Note (Signed)
Lab Results  Component Value Date   CHOL 131 09/27/2021   HDL 54 09/27/2021   LDLCALC 65 09/27/2021   TRIG 52 09/27/2021   CHOLHDL 2.4 09/27/2021  Medical condition well-controlled on pravastatin 40 mg daily Continue current medication Avoid fatty fried foods Check labs at next visit

## 2022-03-07 NOTE — Patient Instructions (Signed)
Please consider getting your shingles vaccine and TDAP vaccines at your pharmacy     It is important that you exercise regularly at least 30 minutes 5 times a week.  Think about what you will eat, plan ahead. Choose " clean, green, fresh or frozen" over canned, processed or packaged foods which are more sugary, salty and fatty. 70 to 75% of food eaten should be vegetables and fruit. Three meals at set times with snacks allowed between meals, but they must be fruit or vegetables. Aim to eat over a 12 hour period , example 7 am to 7 pm, and STOP after  your last meal of the day. Drink water,generally about 64 ounces per day, no other drink is as healthy. Fruit juice is best enjoyed in a healthy way, by EATING the fruit.  Thanks for choosing [facility], we consider it a privelige to serve you.

## 2022-03-07 NOTE — Assessment & Plan Note (Signed)
ROM Of motion exercises encouraged  Take OTC tylenol as needed, he refused prescription medications today

## 2022-03-07 NOTE — Assessment & Plan Note (Signed)
Patient counseled on low-carb modified diet Encouraged to engage in regular daily walking exercise at least 150 minutes weekly

## 2022-03-08 ENCOUNTER — Other Ambulatory Visit: Payer: Self-pay | Admitting: Nurse Practitioner

## 2022-03-08 LAB — BASIC METABOLIC PANEL
BUN/Creatinine Ratio: 12 (ref 10–24)
BUN: 13 mg/dL (ref 8–27)
CO2: 26 mmol/L (ref 20–29)
Calcium: 10.8 mg/dL — ABNORMAL HIGH (ref 8.6–10.2)
Chloride: 99 mmol/L (ref 96–106)
Creatinine, Ser: 1.11 mg/dL (ref 0.76–1.27)
Glucose: 94 mg/dL (ref 70–99)
Potassium: 3.9 mmol/L (ref 3.5–5.2)
Sodium: 139 mmol/L (ref 134–144)
eGFR: 76 mL/min/{1.73_m2} (ref >59)

## 2022-03-08 NOTE — Progress Notes (Signed)
I added on PTH to his labs please notify labs thanks

## 2022-03-10 LAB — PTH, INTACT AND CALCIUM
Calcium: 9.9 mg/dL (ref 8.6–10.2)
PTH: 38 pg/mL (ref 15–65)

## 2022-03-10 NOTE — Progress Notes (Signed)
Normal labs Patient should avoid taking calcium supplements

## 2022-04-19 ENCOUNTER — Ambulatory Visit (INDEPENDENT_AMBULATORY_CARE_PROVIDER_SITE_OTHER): Payer: Medicaid Other

## 2022-04-19 DIAGNOSIS — Z23 Encounter for immunization: Secondary | ICD-10-CM | POA: Diagnosis not present

## 2022-09-07 ENCOUNTER — Ambulatory Visit (INDEPENDENT_AMBULATORY_CARE_PROVIDER_SITE_OTHER): Payer: 59 | Admitting: Family Medicine

## 2022-09-07 ENCOUNTER — Encounter: Payer: Self-pay | Admitting: Family Medicine

## 2022-09-07 VITALS — BP 140/90 | HR 78 | Ht 68.0 in | Wt 178.0 lb

## 2022-09-07 DIAGNOSIS — E785 Hyperlipidemia, unspecified: Secondary | ICD-10-CM | POA: Diagnosis not present

## 2022-09-07 DIAGNOSIS — Z122 Encounter for screening for malignant neoplasm of respiratory organs: Secondary | ICD-10-CM | POA: Diagnosis not present

## 2022-09-07 DIAGNOSIS — F17200 Nicotine dependence, unspecified, uncomplicated: Secondary | ICD-10-CM | POA: Diagnosis not present

## 2022-09-07 DIAGNOSIS — E038 Other specified hypothyroidism: Secondary | ICD-10-CM

## 2022-09-07 DIAGNOSIS — E559 Vitamin D deficiency, unspecified: Secondary | ICD-10-CM

## 2022-09-07 DIAGNOSIS — R7301 Impaired fasting glucose: Secondary | ICD-10-CM

## 2022-09-07 DIAGNOSIS — Z0001 Encounter for general adult medical examination with abnormal findings: Secondary | ICD-10-CM | POA: Diagnosis not present

## 2022-09-07 DIAGNOSIS — I10 Essential (primary) hypertension: Secondary | ICD-10-CM | POA: Diagnosis not present

## 2022-09-07 MED ORDER — LISINOPRIL-HYDROCHLOROTHIAZIDE 20-25 MG PO TABS: 1.0000 | ORAL_TABLET | Freq: Every day | ORAL | 1 refills | Status: DC

## 2022-09-07 MED ORDER — PRAVASTATIN SODIUM 40 MG PO TABS: 40.0000 mg | ORAL_TABLET | Freq: Every morning | ORAL | 3 refills | Status: DC

## 2022-09-07 MED ORDER — AMLODIPINE BESYLATE 10 MG PO TABS: 10.0000 mg | ORAL_TABLET | Freq: Every morning | ORAL | 3 refills | Status: DC

## 2022-09-07 NOTE — Assessment & Plan Note (Signed)
Reports smoking cessation for a month Smoked a pack a day since [place] patient on smoking cessation Low-dose lung cancer screening ordered

## 2022-09-07 NOTE — Assessment & Plan Note (Signed)
Uncontrolled Reports compliance with treatment regimen Reports normal ambulatory blood pressure readings Encouraged patient to check BP at home and bring readings to his follow-up visit Patient is asymptomatic today Low-sodium diet with increased physical activity encourage We will follow-up on BP in 2 weeks BP Readings from Last 3 Encounters:  09/07/22 (!) 140/90  03/07/22 120/78  10/27/21 140/90

## 2022-09-07 NOTE — Progress Notes (Signed)
Complete physical exam  Patient: Antonio Small   DOB: May 25, 1961   62 y.o. Male  MRN: [id number]  Subjective:    Chief Complaint  Patient presents with   Annual Exam    Cpe today. Needs refills on his meds today. States he quit smoking at the beginning of the year.    Antonio Small is a 62 y.o. male who presents today for a complete physical exam. He reports consuming a general diet. Home exercise routine includes walking daily.    He generally feels well. He reports sleeping well. He does not have additional problems to discuss today.    Most recent fall risk assessment:    09/07/2022    8:09 AM  [place] in the past year? 0  Number falls in past yr: 0  Injury with Fall? 0  Risk for fall due to : No Fall Risks  Follow up Falls evaluation completed     Most recent depression screenings:    09/07/2022    8:09 AM 03/07/2022    8:07 AM  PHQ 2/9 Scores  PHQ - 2 Score 0 4  PHQ- 9 Score 0 10    Dental: No current dental problems and Last dental visit: 06/2023  Patient Active Problem List   Diagnosis Date Noted   Depression, major, single episode, moderate ([place]) 03/07/2022   Overweight (BMI 25.0-29.9) 03/07/2022   Hypercalcemia 03/07/2022   Trigger finger, right ring finger 03/07/2022   Immunization refused 10/27/2021   Urinary frequency 10/27/2021   Encounter for annual general medical examination with abnormal findings in adult 09/26/2021   Immunization due 04/21/2021   Screening due 02/15/2021   TOBACCO ABUSE 10/12/2006   HLD (hyperlipidemia) 09/03/2006   Hypertension, essential 09/03/2006   LOW BACK PAIN 09/03/2006   Past Medical History:  Diagnosis Date   Chronic back pain    Hyperlipidemia    Hypertension    Past Surgical History:  Procedure Laterality Date   BACK SURGERY     HERNIA REPAIR Left    INGUINAL HERNIA REPAIR Right 05/13/2020   Procedure: RIGHT INGUINAL HERNIA REPAIR WITH MESH;  Surgeon: Coralie Keens, MD;  Location: [facility];  Service: General;  Laterality: Right;   INSERTION OF MESH Right 05/13/2020   Procedure: INSERTION OF MESH;  Surgeon: Coralie Keens, MD;  Location: [facility];  Service: General;  Laterality: Right;   Social History   Tobacco Use   Smoking status: Former    Packs/day: 0.50    Years: 40.00    Total pack years: 20.00    Types: Cigarettes    Quit date: 08/07/2022    Years since quitting: 0.0   Smokeless tobacco: Never  Vaping Use   Vaping Use: Never used  Substance Use Topics   Alcohol use: Not Currently    Comment: occassionally   Drug use: No   Social History   Socioeconomic History   Marital status: Legally Separated    Spouse name: Not on file   Number of children: 1   Years of education: Not on file   Highest education level: Not on file  Occupational History   Occupation: Disabled    Comment: d/t back pain  Tobacco Use   Smoking status: Former    Packs/day: 0.50    Years: 40.00    Total pack years: 20.00    Types: Cigarettes    Quit date: 08/07/2022    Years since quitting: 0.0  Smokeless tobacco: Never  Vaping Use   Vaping Use: Never used  Substance and Sexual Activity   Alcohol use: Not Currently    Comment: occassionally   Drug use: No   Sexual activity: Yes  Other Topics Concern   Not on file  Social History Narrative   1 daughter   Social Determinants of Health   Financial Resource Strain: Not on file  Food Insecurity: Not on file  Transportation Needs: Not on file  Physical Activity: Not on file  Stress: Not on file  Social Connections: Not on file  Intimate Partner Violence: Not on file   Family Status  Relation Name Status   Mother  Deceased   Father  Deceased   Sister  Alive       HTN   Family History  Problem Relation Age of Onset   Hypertension Mother    Hypertension Father    Hypertension Sister    No Known Allergies    Patient Care Team: Gilmore Laroche, FNP as PCP - General (Family  Medicine)   Outpatient Medications Prior to Visit  Medication Sig   aspirin EC 81 MG tablet Take 81 mg by mouth every morning.   Blood Pressure Monitoring (COMFORT TOUCH BP CUFF/MEDIUM) MISC 1 each by Does not apply route daily.   [DISCONTINUED] amLODipine (NORVASC) 10 MG tablet Take 1 tablet (10 mg total) by mouth every morning.   [DISCONTINUED] lisinopril-hydrochlorothiazide (ZESTORETIC) 20-25 MG tablet Take 1 tablet by mouth daily.   [DISCONTINUED] pravastatin (PRAVACHOL) 40 MG tablet Take 1 tablet (40 mg total) by mouth every morning.   No facility-administered medications prior to visit.    Review of Systems  Constitutional:  Negative for chills, fever and malaise/fatigue.  HENT:  Negative for congestion and sinus pain.   Eyes:  Negative for pain, discharge and redness.  Respiratory:  Negative for cough, sputum production and shortness of breath.   Cardiovascular:  Negative for chest pain, palpitations, claudication and leg swelling.  Gastrointestinal:  Negative for diarrhea, heartburn and nausea.  Genitourinary:  Negative for flank pain and frequency.  Musculoskeletal:  Negative for back pain and joint pain.  Skin:  Negative for itching.  Neurological:  Negative for dizziness, seizures and headaches.  Endo/Heme/Allergies:  Negative for environmental allergies.  Psychiatric/Behavioral:  Negative for memory loss. The patient does not have insomnia.        Objective:    BP (!) 140/90   Pulse 78   Ht 5\' 8"  (1.727 m)   Wt 178 lb 0.6 oz (80.8 kg)   SpO2 95%   BMI 27.07 kg/m  BP Readings from Last 3 Encounters:  09/07/22 (!) 140/90  03/07/22 120/78  10/27/21 140/90   Wt Readings from Last 3 Encounters:  09/07/22 178 lb 0.6 oz (80.8 kg)  03/07/22 170 lb (77.1 kg)  10/27/21 170 lb (77.1 kg)      Physical Exam HENT:     Head: Normocephalic.     Right Ear: External ear normal.     Left Ear: External ear normal.     Nose: No congestion.     Mouth/Throat:     Mouth:  Mucous membranes are moist.  Eyes:     Extraocular Movements: Extraocular movements intact.     Pupils: Pupils are equal, round, and reactive to light.  Cardiovascular:     Rate and Rhythm: Regular rhythm.     Heart sounds: No murmur heard. Pulmonary:     Effort: No respiratory distress.  Breath sounds: Normal breath sounds.  Abdominal:     Tenderness: There is no right CVA tenderness or left CVA tenderness.  Musculoskeletal:     Right lower leg: No edema.     Left lower leg: No edema.  Neurological:     Mental Status: He is alert and oriented to person, place, and time.     GCS: GCS eye subscore is 4. GCS verbal subscore is 5. GCS motor subscore is 6.     Cranial Nerves: No facial asymmetry.     Motor: No atrophy.     Coordination: Coordination normal. Finger-Nose-Finger Test normal.     Gait: Gait normal.     Comments: Alert and Oriented x 3 Speech clear with no aphasia Cranial Nerve testing - Visual Fields grossly intact - PERRLA. EOM intact. No Nystagmus - Facial Sensation grossly intact - No facial asymmetry - Uvula and Tongue Midline - Accessory Muscles intact Motor: - 5/5 motor strength in all four extremities.  - No pronator Drift - Normal tone Sensation: - Grossly intact in all four extremities.  Coordination:  - Finger to nose and heel to shin intact bilaterally - Gait without abnormality.   Psychiatric:        Judgment: Judgment normal.     No results found for any visits on 09/07/22. Last CBC Lab Results  Component Value Date   WBC 4.4 05/13/2021   HGB 15.6 05/13/2021   HCT 47.2 05/13/2021   MCV 95 05/13/2021   MCH 31.5 05/13/2021   RDW 11.4 (L) 05/13/2021   PLT 184 40/03/6760   Last metabolic panel Lab Results  Component Value Date   GLUCOSE 94 03/07/2022   NA 139 03/07/2022   K 3.9 03/07/2022   CL 99 03/07/2022   CO2 26 03/07/2022   BUN 13 03/07/2022   CREATININE 1.11 03/07/2022   EGFR 76 03/07/2022   CALCIUM 9.9 03/08/2022   PROT  7.2 09/27/2021   ALBUMIN 4.5 09/27/2021   LABGLOB 2.7 09/27/2021   AGRATIO 1.7 09/27/2021   BILITOT 0.5 09/27/2021   ALKPHOS 89 09/27/2021   AST 23 09/27/2021   ALT 21 09/27/2021   ANIONGAP 9 05/12/2020   Last lipids Lab Results  Component Value Date   CHOL 131 09/27/2021   HDL 54 09/27/2021   LDLCALC 65 09/27/2021   TRIG 52 09/27/2021   CHOLHDL 2.4 09/27/2021   Last hemoglobin A1c Lab Results  Component Value Date   HGBA1C 5.2 10/27/2021   Last thyroid functions No results found for: "TSH", "T3TOTAL", "T4TOTAL", "THYROIDAB" Last vitamin D No results found for: "25OHVITD2", "25OHVITD3", "VD25OH" Last vitamin B12 and Folate No results found for: "VITAMINB12", "FOLATE"      Assessment & Plan:    Routine Health Maintenance and Physical Exam  Immunization History  Administered Date(s) Administered   Influenza,inj,Quad PF,6+ Mos 05/03/2020, 04/21/2021, 04/19/2022   Moderna Sars-Covid-2 Vaccination 10/18/2019, 11/19/2019    Health Maintenance  Topic Date Due   DTaP/Tdap/Td (1 - Tdap) Never done   Zoster Vaccines- Shingrix (1 of 2) Never done   Lung Cancer Screening  09/07/2011   Fecal DNA (Cologuard)  03/24/2024 (Originally 03/16/2006)   INFLUENZA VACCINE  Completed   Hepatitis C Screening  Completed   HIV Screening  Completed   HPV VACCINES  Aged Out   COVID-19 Vaccine  Discontinued    Discussed health benefits of physical activity, and encouraged him to engage in regular exercise appropriate for his age and condition.  Encounter for annual general  medical examination with abnormal findings in adult Assessment & Plan: Physical exam as documented Counseling is done on healthy lifestyle involving commitment to 150 minutes of exercise per week,  Discussed heart-healthy diet Education has follow Eat three meals per day at times discussed. Cut out all diet bevergages and drink only water Eat whole food plant based meals Cut out junk food, fast food and processed  foods Exercise 150 minutes a week Lose 1-2 lbs per week. Keep a food journal Choose foods that grow in a garden or in a fruit orchard and protein of animals with fins or feathers.  Lifestyle Medicine - Whole Food, Plant Predominant Nutrition is highly recommended: Eat Plenty of vegetables, Mushrooms, fruits, Legumes, Whole Grains, Nuts, seeds in lieu of processed meats, processed snacks/pastries red meat, poultry, eggs.    -It is better to avoid simple carbohydrates including: Cakes, Sweet Desserts, Ice Cream, Soda (diet and regular), Sweet Tea, Candies, Chips, Cookies, Store Bought Juices, Alcohol in Excess of  1-2 drinks a day, Lemonade,  Artificial Sweeteners, Doughnuts, Coffee Creamers, "Sugar-free" Products, etc, etc.  This is not a complete list..... Exercise: If you are able: 30 -60 minutes a day ,4 days a week, or 150 minutes a week.  The longer the better.  Combine stretch, strength, and aerobic activities.  If you were told in the past that you have high risk for cardiovascular diseases, you may seek evaluation by your heart doctor prior to initiating moderate to intense exercise programs.           Hypertension, essential Assessment & Plan: Uncontrolled Reports compliance with treatment regimen Reports normal ambulatory blood pressure readings Encouraged patient to check BP at home and bring readings to his follow-up visit Patient is asymptomatic today Low-sodium diet with increased physical activity encourage We will follow-up on BP in 2 weeks BP Readings from Last 3 Encounters:  09/07/22 (!) 140/90  03/07/22 120/78  10/27/21 140/90     Orders: -     amLODIPine Besylate; Take 1 tablet (10 mg total) by mouth every morning.  Dispense: 90 tablet; Refill: 3 -     Lisinopril-hydroCHLOROthiazide; Take 1 tablet by mouth daily.  Dispense: 90 tablet; Refill: 1 -     CMP14+EGFR -     CBC with Differential/Platelet  TOBACCO ABUSE Assessment & Plan: Reports smoking cessation  for a month Smoked a pack a day since [place] patient on smoking cessation Low-dose lung cancer screening ordered    Screening for lung cancer -     CT CHEST LUNG CANCER SCREENING LOW DOSE WO CONTRAST  Vitamin D deficiency -     VITAMIN D 25 Hydroxy (Vit-D Deficiency, Fractures)  Impaired fasting blood sugar -     Hemoglobin A1c  Hyperlipidemia, unspecified hyperlipidemia type -     Pravastatin Sodium; Take 1 tablet (40 mg total) by mouth every morning.  Dispense: 90 tablet; Refill: 3 -     Lipid panel  Other specified hypothyroidism -     TSH + free T4    Return in about 2 weeks (around 09/21/2022) for BP.     Alvira Monday, FNP

## 2022-09-07 NOTE — Patient Instructions (Signed)
I appreciate the opportunity to provide care to you today!    Follow up:  2 weeks for BP  Labs: please stop by the lab today to get your blood drawn (CBC, CMP, TSH, Lipid profile, HgA1c, Vit D)    Please continue to a heart-healthy diet and increase your physical activities. Try to exercise for 19mins at least five times a week.  Physical activity helps: Lower your blood glucose, improve your heart health, lower your blood pressure and cholesterol, burn calories to help manage her weight, gave you energy, lower stress, and improve his sleep.  The American diabetes Association (ADA) recommends being active for 2-1/2 hours (150 minutes) or more week.  Exercise for 30 minutes, 5 days a week (150 minutes total)    It was a pleasure to see you and I look forward to continuing to work together on your health and well-being. Please do not hesitate to call the office if you need care or have questions about your care.   Have a wonderful day and week. With Gratitude, Alvira Monday MSN, FNP-BC

## 2022-09-07 NOTE — Assessment & Plan Note (Signed)
Physical exam as documented Counseling is done on healthy lifestyle involving commitment to 150 minutes of exercise per week,  Discussed heart-healthy diet Education has follow Eat three meals per day at times discussed. Cut out all diet bevergages and drink only water Eat whole food plant based meals Cut out junk food, fast food and processed foods Exercise 150 minutes a week Lose 1-2 lbs per week. Keep a food journal Choose foods that grow in a garden or in a fruit orchard and protein of animals with fins or feathers.  Lifestyle Medicine - Whole Food, Plant Predominant Nutrition is highly recommended: Eat Plenty of vegetables, Mushrooms, fruits, Legumes, Whole Grains, Nuts, seeds in lieu of processed meats, processed snacks/pastries red meat, poultry, eggs.    -It is better to avoid simple carbohydrates including: Cakes, Sweet Desserts, Ice Cream, Soda (diet and regular), Sweet Tea, Candies, Chips, Cookies, Store Bought Juices, Alcohol in Excess of  1-2 drinks a day, Lemonade,  Artificial Sweeteners, Doughnuts, Coffee Creamers, "Sugar-free" Products, etc, etc.  This is not a complete list..... Exercise: If you are able: 30 -60 minutes a day ,4 days a week, or 150 minutes a week.  The longer the better.  Combine stretch, strength, and aerobic activities.  If you were told in the past that you have high risk for cardiovascular diseases, you may seek evaluation by your heart doctor prior to initiating moderate to intense exercise programs.

## 2022-09-08 LAB — CBC WITH DIFFERENTIAL/PLATELET
Basophils Absolute: 0 10*3/uL (ref 0.0–0.2)
Basos: 1 %
EOS (ABSOLUTE): 0.3 10*3/uL (ref 0.0–0.4)
Eos: 6 %
Hematocrit: 47.2 % (ref 37.5–51.0)
Hemoglobin: 15.8 g/dL (ref 13.0–17.7)
Immature Grans (Abs): 0 10*3/uL (ref 0.0–0.1)
Immature Granulocytes: 0 %
Lymphocytes Absolute: 1.7 10*3/uL (ref 0.7–3.1)
Lymphs: 34 %
MCH: 30.9 pg (ref 26.6–33.0)
MCHC: 33.5 g/dL (ref 31.5–35.7)
MCV: 92 fL (ref 79–97)
Monocytes Absolute: 0.8 10*3/uL (ref 0.1–0.9)
Monocytes: 15 %
Neutrophils Absolute: 2.2 10*3/uL (ref 1.4–7.0)
Neutrophils: 44 %
Platelets: 295 10*3/uL (ref 150–450)
RBC: 5.12 x10E6/uL (ref 4.14–5.80)
RDW: 11.5 % — ABNORMAL LOW (ref 11.6–15.4)
WBC: 5.1 10*3/uL (ref 3.4–10.8)

## 2022-09-08 LAB — CMP14+EGFR
ALT: 35 IU/L (ref 0–44)
AST: 26 IU/L (ref 0–40)
Albumin/Globulin Ratio: 1.4 (ref 1.2–2.2)
Albumin: 4.4 g/dL (ref 3.9–4.9)
Alkaline Phosphatase: 99 IU/L (ref 44–121)
BUN/Creatinine Ratio: 14 (ref 10–24)
BUN: 17 mg/dL (ref 8–27)
Bilirubin Total: 0.6 mg/dL (ref 0.0–1.2)
CO2: 25 mmol/L (ref 20–29)
Calcium: 10.2 mg/dL (ref 8.6–10.2)
Chloride: 98 mmol/L (ref 96–106)
Creatinine, Ser: 1.19 mg/dL (ref 0.76–1.27)
Globulin, Total: 3.2 g/dL (ref 1.5–4.5)
Glucose: 98 mg/dL (ref 70–99)
Potassium: 4.4 mmol/L (ref 3.5–5.2)
Sodium: 138 mmol/L (ref 134–144)
Total Protein: 7.6 g/dL (ref 6.0–8.5)
eGFR: 69 mL/min/{1.73_m2} (ref >59)

## 2022-09-08 LAB — LIPID PANEL
Chol/HDL Ratio: 3.1 ratio (ref 0.0–5.0)
Cholesterol, Total: 146 mg/dL (ref 100–199)
HDL: 47 mg/dL (ref >39)
LDL Chol Calc (NIH): 85 mg/dL (ref 0–99)
Triglycerides: 70 mg/dL (ref 0–149)
VLDL Cholesterol Cal: 14 mg/dL (ref 5–40)

## 2022-09-08 LAB — HEMOGLOBIN A1C
Est. average glucose Bld gHb Est-mCnc: 111 mg/dL
Hgb A1c MFr Bld: 5.5 % (ref 4.8–5.6)

## 2022-09-08 LAB — TSH+FREE T4
Free T4: 1.35 ng/dL (ref 0.82–1.77)
TSH: 1.42 u[IU]/mL (ref 0.450–4.500)

## 2022-09-08 LAB — VITAMIN D 25 HYDROXY (VIT D DEFICIENCY, FRACTURES): Vit D, 25-Hydroxy: 16.2 ng/mL — ABNORMAL LOW (ref 30.0–100.0)

## 2022-09-12 ENCOUNTER — Other Ambulatory Visit: Payer: Self-pay | Admitting: Family Medicine

## 2022-09-12 DIAGNOSIS — E559 Vitamin D deficiency, unspecified: Secondary | ICD-10-CM

## 2022-09-12 MED ORDER — VITAMIN D (ERGOCALCIFEROL) 1.25 MG (50000 UNIT) PO CAPS: 50000.0000 [IU] | ORAL_CAPSULE | ORAL | 1 refills | Status: DC

## 2022-09-12 NOTE — Progress Notes (Signed)
A weekly vitamin D supplement prescription has been sent to your pharmacy because your vitamin D is low.  Your thyroid, kidneys, and liver function are stable.

## 2022-09-26 ENCOUNTER — Ambulatory Visit (HOSPITAL_COMMUNITY): Payer: 59

## 2022-09-28 ENCOUNTER — Ambulatory Visit (INDEPENDENT_AMBULATORY_CARE_PROVIDER_SITE_OTHER): Payer: 59 | Admitting: Family Medicine

## 2022-09-28 ENCOUNTER — Encounter: Payer: Self-pay | Admitting: Family Medicine

## 2022-09-28 ENCOUNTER — Other Ambulatory Visit: Payer: Self-pay | Admitting: Family Medicine

## 2022-09-28 VITALS — BP 152/98 | HR 92 | Ht 68.0 in | Wt 183.1 lb

## 2022-09-28 DIAGNOSIS — I10 Essential (primary) hypertension: Secondary | ICD-10-CM | POA: Diagnosis not present

## 2022-09-28 MED ORDER — OLMESARTAN-AMLODIPINE-HCTZ 40-10-12.5 MG PO TABS: 1.0000 | ORAL_TABLET | Freq: Every day | ORAL | 1 refills | Status: DC

## 2022-09-28 NOTE — Assessment & Plan Note (Signed)
Controlled He reports taking amlodipine 10 mg daily and lisinopril hydrochlorothiazide 20-25 He reports adherence with the treatment regimen He denies headaches, chest pain, palpitation, and  shortness of breath Will discontinue lisinopril hydrochlorothiazide 20-25 Will start the patient on a combination medications to decrease the pill burden Encourage patient to start taking Olmesartan-amlodipine-hydrochlorothiazide 40-10-12.5 Encourage adherence to low-sodium diet and increase physical activity Will follow-up on BP in 4 weeks Pending CBC and BMP BP Readings from Last 3 Encounters:  09/28/22 (!) 152/98  09/07/22 (!) 140/90  03/07/22 120/78

## 2022-09-28 NOTE — Progress Notes (Signed)
Established Patient Office Visit  Subjective:  Patient ID: Antonio Small, male    DOB: 1960-11-03  Age: 62 y.o. MRN: [id number]  CC:  Chief Complaint  Patient presents with   Follow-up    2 week f/u for htn. Pt reports doing well.     HPI Antonio Small is a 62 y.o. male with past medical history of hypertension, tobacco use, and hyperlipidemia presents for f/u of  chronic medical conditions. For the details of today's visit, please refer to the assessment and plan.     Past Medical History:  Diagnosis Date   Chronic back pain    Hyperlipidemia    Hypertension     Past Surgical History:  Procedure Laterality Date   BACK SURGERY     HERNIA REPAIR Left    INGUINAL HERNIA REPAIR Right 05/13/2020   Procedure: RIGHT INGUINAL HERNIA REPAIR WITH MESH;  Surgeon: Coralie Keens, MD;  Location: [facility];  Service: General;  Laterality: Right;   INSERTION OF MESH Right 05/13/2020   Procedure: INSERTION OF MESH;  Surgeon: Coralie Keens, MD;  Location: [facility];  Service: General;  Laterality: Right;    Family History  Problem Relation Age of Onset   Hypertension Mother    Hypertension Father    Hypertension Sister     Social History   Socioeconomic History   Marital status: Legally Separated    Spouse name: Not on file   Number of children: 1   Years of education: Not on file   Highest education level: Not on file  Occupational History   Occupation: Disabled    Comment: d/t back pain  Tobacco Use   Smoking status: Former    Packs/day: 0.50    Years: 40.00    Total pack years: 20.00    Types: Cigarettes    Quit date: 08/07/2022    Years since quitting: 0.1   Smokeless tobacco: Never  Vaping Use   Vaping Use: Never used  Substance and Sexual Activity   Alcohol use: Not Currently    Comment: occassionally   Drug use: No   Sexual activity: Yes  Other Topics Concern   Not on file  Social History Narrative   1 daughter    Social Determinants of Health   Financial Resource Strain: Not on file  Food Insecurity: Not on file  Transportation Needs: Not on file  Physical Activity: Not on file  Stress: Not on file  Social Connections: Not on file  Intimate Partner Violence: Not on file    Outpatient Medications Prior to Visit  Medication Sig Dispense Refill   aspirin EC 81 MG tablet Take 81 mg by mouth every morning.     Blood Pressure Monitoring (COMFORT TOUCH BP CUFF/MEDIUM) MISC 1 each by Does not apply route daily. 1 each 0   pravastatin (PRAVACHOL) 40 MG tablet Take 1 tablet (40 mg total) by mouth every morning. 90 tablet 3   Vitamin D, Ergocalciferol, (DRISDOL) 1.25 MG (50000 UNIT) CAPS capsule Take 1 capsule (50,000 Units total) by mouth every 7 (seven) days. 10 capsule 1   amLODipine (NORVASC) 10 MG tablet Take 1 tablet (10 mg total) by mouth every morning. 90 tablet 3   lisinopril-hydrochlorothiazide (ZESTORETIC) 20-25 MG tablet Take 1 tablet by mouth daily. 90 tablet 1   No facility-administered medications prior to visit.    No Known Allergies  ROS Review of Systems  Constitutional:  Negative for fatigue and fever.  Eyes:  Negative for visual disturbance.  Respiratory:  Negative for chest tightness and shortness of breath.   Cardiovascular:  Negative for chest pain and palpitations.  Neurological:  Negative for dizziness and headaches.      Objective:    Physical Exam HENT:     Head: Normocephalic.     Right Ear: External ear normal.     Left Ear: External ear normal.     Nose: No congestion or rhinorrhea.     Mouth/Throat:     Mouth: Mucous membranes are moist.  Cardiovascular:     Rate and Rhythm: Regular rhythm.     Heart sounds: No murmur heard. Pulmonary:     Effort: No respiratory distress.     Breath sounds: Normal breath sounds.  Neurological:     Mental Status: He is alert.     BP (!) 152/98 (BP Location: Left Arm)   Pulse 92   Ht 5' 8"$  (1.727 m)   Wt 183 lb  1.3 oz (83 kg)   SpO2 95%   BMI 27.84 kg/m  Wt Readings from Last 3 Encounters:  09/28/22 183 lb 1.3 oz (83 kg)  09/07/22 178 lb 0.6 oz (80.8 kg)  03/07/22 170 lb (77.1 kg)    Lab Results  Component Value Date   TSH 1.420 09/07/2022   Lab Results  Component Value Date   WBC 5.1 09/07/2022   HGB 15.8 09/07/2022   HCT 47.2 09/07/2022   MCV 92 09/07/2022   PLT 295 09/07/2022   Lab Results  Component Value Date   NA 138 09/07/2022   K 4.4 09/07/2022   CO2 25 09/07/2022   GLUCOSE 98 09/07/2022   BUN 17 09/07/2022   CREATININE 1.19 09/07/2022   BILITOT 0.6 09/07/2022   ALKPHOS 99 09/07/2022   AST 26 09/07/2022   ALT 35 09/07/2022   PROT 7.6 09/07/2022   ALBUMIN 4.4 09/07/2022   CALCIUM 10.2 09/07/2022   ANIONGAP 9 05/12/2020   EGFR 69 09/07/2022   Lab Results  Component Value Date   CHOL 146 09/07/2022   Lab Results  Component Value Date   HDL 47 09/07/2022   Lab Results  Component Value Date   LDLCALC 85 09/07/2022   Lab Results  Component Value Date   TRIG 70 09/07/2022   Lab Results  Component Value Date   CHOLHDL 3.1 09/07/2022   Lab Results  Component Value Date   HGBA1C 5.5 09/07/2022      Assessment & Plan:  Hypertension, essential Assessment & Plan: Controlled He reports taking amlodipine 10 mg daily and lisinopril hydrochlorothiazide 20-25 He reports adherence with the treatment regimen He denies headaches, chest pain, palpitation, and  shortness of breath Will discontinue lisinopril hydrochlorothiazide 20-25 Will start the patient on a combination medications to decrease the pill burden Encourage patient to start taking Olmesartan-amlodipine-hydrochlorothiazide 40-10-12.5 Encourage adherence to low-sodium diet and increase physical activity Will follow-up on BP in 4 weeks Pending CBC and BMP BP Readings from Last 3 Encounters:  09/28/22 (!) 152/98  09/07/22 (!) 140/90  03/07/22 120/78      Orders: -      Olmesartan-amLODIPine-HCTZ; Take 1 tablet by mouth daily.  Dispense: 30 tablet; Refill: 1 -     CBC -     BMP8+EGFR    Follow-up: Return in about 1 month (around 10/27/2022).   Alvira Monday, FNP

## 2022-09-28 NOTE — Patient Instructions (Addendum)
I appreciate the opportunity to provide care to you today!    Follow up:  1 month for BP  Labs: please stop by the lab today to get your blood drawn (CBC, BMP)  Stop taking amlodipine 10 mg and lisinopril-Hydrochlorothiazide 20-25   Start taking olmesartan-amlodipine- hydrochlorothiazide  40-10-12.5   Please continue to a heart-healthy diet and increase your physical activities. Try to exercise for 20mns at least five times a week.   Physical activity helps: Lower your blood glucose, improve your heart health, lower your blood pressure and cholesterol, burn calories to help manage her weight, gave you energy, lower stress, and improve his sleep.  The American diabetes Association (ADA) recommends being active for 2-1/2 hours (150 minutes) or more week.  Exercise for 30 minutes, 5 days a week (150 minutes total)    It was a pleasure to see you and I look forward to continuing to work together on your health and well-being. Please do not hesitate to call the office if you need care or have questions about your care.   Have a wonderful day and week. With Gratitude, GAlvira MondayMSN, FNP-BC

## 2022-09-29 LAB — BMP8+EGFR
BUN/Creatinine Ratio: 10 (ref 10–24)
BUN: 12 mg/dL (ref 8–27)
CO2: 27 mmol/L (ref 20–29)
Calcium: 10.2 mg/dL (ref 8.6–10.2)
Chloride: 97 mmol/L (ref 96–106)
Creatinine, Ser: 1.15 mg/dL (ref 0.76–1.27)
Glucose: 96 mg/dL (ref 70–99)
Potassium: 3.4 mmol/L — ABNORMAL LOW (ref 3.5–5.2)
Sodium: 140 mmol/L (ref 134–144)
eGFR: 72 mL/min/{1.73_m2} (ref >59)

## 2022-09-29 LAB — CBC
Hematocrit: 44.4 % (ref 37.5–51.0)
Hemoglobin: 14.9 g/dL (ref 13.0–17.7)
MCH: 31.4 pg (ref 26.6–33.0)
MCHC: 33.6 g/dL (ref 31.5–35.7)
MCV: 94 fL (ref 79–97)
Platelets: 261 10*3/uL (ref 150–450)
RBC: 4.75 x10E6/uL (ref 4.14–5.80)
RDW: 11.2 % — ABNORMAL LOW (ref 11.6–15.4)
WBC: 5.2 10*3/uL (ref 3.4–10.8)

## 2022-09-29 NOTE — Progress Notes (Signed)
Please encourage the patient to increase his dietary intake of potassium, his potassium is slightly low. All other labs are stable

## 2022-10-30 ENCOUNTER — Ambulatory Visit (INDEPENDENT_AMBULATORY_CARE_PROVIDER_SITE_OTHER): Payer: 59 | Admitting: Family Medicine

## 2022-10-30 ENCOUNTER — Other Ambulatory Visit: Payer: Self-pay | Admitting: Family Medicine

## 2022-10-30 ENCOUNTER — Encounter: Payer: Self-pay | Admitting: Family Medicine

## 2022-10-30 VITALS — BP 140/82 | HR 87 | Ht 68.0 in | Wt 185.0 lb

## 2022-10-30 DIAGNOSIS — I10 Essential (primary) hypertension: Secondary | ICD-10-CM

## 2022-10-30 MED ORDER — OLMESARTAN-AMLODIPINE-HCTZ 40-10-12.5 MG PO TABS: 1.0000 | ORAL_TABLET | Freq: Every day | ORAL | 1 refills | Status: DC

## 2022-10-30 NOTE — Patient Instructions (Signed)
I appreciate the opportunity to provide care to you today!    Follow up:  2 weeks for BP  Labs:next visit   I recommend low-sodium diet with increased physical activity I less than 1500 mg of daily sodium intake Please pick up your refill at the pharmacy   Please continue to a heart-healthy diet and increase your physical activities. Try to exercise for 37mins at least five days a week.   Physical activity helps: Lower your blood glucose, improve your heart health, lower your blood pressure and cholesterol, burn calories to help manage her weight, gave you energy, lower stress, and improve his sleep.  The American diabetes Association (ADA) recommends being active for 2-1/2 hours (150 minutes) or more week.  Exercise for 30 minutes, 5 days a week (150 minutes total)    It was a pleasure to see you and I look forward to continuing to work together on your health and well-being. Please do not hesitate to call the office if you need care or have questions about your care.   Have a wonderful day and week. With Gratitude, Alvira Monday MSN, FNP-BC

## 2022-10-30 NOTE — Assessment & Plan Note (Addendum)
Uncontrolled He takes olmesartan-amlodipine-hydrochlorothiazide 40-10-12.5 Reports that he has been out of his blood pressure medication since yesterday Denies headaches, dizziness, blurred vision, chest pain, palpitation, shortness of breath Refill sent to the pharmacy Low-sodium diet with increased physical activity encourage We will follow-up in 2 weeks BP Readings from Last 3 Encounters:  10/30/22 (!) 140/82  09/28/22 (!) 152/98  09/07/22 (!) 140/90

## 2022-10-30 NOTE — Progress Notes (Signed)
Established Patient Office Visit  Subjective:  Patient ID: Antonio Small, male    DOB: 10-16-1960  Age: 62 y.o. MRN: [id number]  CC:  Chief Complaint  Patient presents with   Follow-up    1 month f/u for htn. Ran out of bp medication will be going to pick it up this morning.     HPI Antonio Small is a 61 y.o. male with past medical history of essential hypertension presents for for blood pressure follow-up. For the details of today's visit, please refer to the assessment and plan.     Past Medical History:  Diagnosis Date   Chronic back pain    Hyperlipidemia    Hypertension     Past Surgical History:  Procedure Laterality Date   BACK SURGERY     HERNIA REPAIR Left    INGUINAL HERNIA REPAIR Right 05/13/2020   Procedure: RIGHT INGUINAL HERNIA REPAIR WITH MESH;  Surgeon: Coralie Keens, MD;  Location: [facility];  Service: General;  Laterality: Right;   INSERTION OF MESH Right 05/13/2020   Procedure: INSERTION OF MESH;  Surgeon: Coralie Keens, MD;  Location: [facility];  Service: General;  Laterality: Right;    Family History  Problem Relation Age of Onset   Hypertension Mother    Hypertension Father    Hypertension Sister     Social History   Socioeconomic History   Marital status: Legally Separated    Spouse name: Not on file   Number of children: 1   Years of education: Not on file   Highest education level: Not on file  Occupational History   Occupation: Disabled    Comment: d/t back pain  Tobacco Use   Smoking status: Former    Packs/day: 0.50    Years: 40.00    Additional pack years: 0.00    Total pack years: 20.00    Types: Cigarettes    Quit date: 08/07/2022    Years since quitting: 0.2   Smokeless tobacco: Never  Vaping Use   Vaping Use: Never used  Substance and Sexual Activity   Alcohol use: Not Currently    Comment: occassionally   Drug use: No   Sexual activity: Yes  Other Topics Concern   Not  on file  Social History Narrative   1 daughter   Social Determinants of Health   Financial Resource Strain: Not on file  Food Insecurity: Not on file  Transportation Needs: Not on file  Physical Activity: Not on file  Stress: Not on file  Social Connections: Not on file  Intimate Partner Violence: Not on file    Outpatient Medications Prior to Visit  Medication Sig Dispense Refill   aspirin EC 81 MG tablet Take 81 mg by mouth every morning.     Blood Pressure Monitoring (COMFORT TOUCH BP CUFF/MEDIUM) MISC 1 each by Does not apply route daily. 1 each 0   pravastatin (PRAVACHOL) 40 MG tablet Take 1 tablet (40 mg total) by mouth every morning. 90 tablet 3   Vitamin D, Ergocalciferol, (DRISDOL) 1.25 MG (50000 UNIT) CAPS capsule Take 1 capsule (50,000 Units total) by mouth every 7 (seven) days. 10 capsule 1   Olmesartan-amLODIPine-HCTZ 40-10-12.5 MG TABS Take 1 tablet by mouth daily. 30 tablet 1   No facility-administered medications prior to visit.    No Known Allergies  ROS Review of Systems  Constitutional:  Negative for fatigue and fever.  Eyes:  Negative for visual disturbance.  Respiratory:  Negative  for chest tightness and shortness of breath.   Cardiovascular:  Negative for chest pain and palpitations.  Neurological:  Negative for dizziness and headaches.      Objective:    Physical Exam HENT:     Head: Normocephalic.     Right Ear: External ear normal.     Left Ear: External ear normal.     Nose: No congestion or rhinorrhea.     Mouth/Throat:     Mouth: Mucous membranes are moist.  Cardiovascular:     Rate and Rhythm: Regular rhythm.     Heart sounds: No murmur heard. Pulmonary:     Effort: No respiratory distress.     Breath sounds: Normal breath sounds.  Neurological:     Mental Status: He is alert.     BP (!) 140/82 (BP Location: Left Arm)   Pulse 87   Ht 5\' 8"  (1.727 m)   Wt 185 lb 0.6 oz (83.9 kg)   SpO2 96%   BMI 28.14 kg/m  Wt Readings from  Last 3 Encounters:  10/30/22 185 lb 0.6 oz (83.9 kg)  09/28/22 183 lb 1.3 oz (83 kg)  09/07/22 178 lb 0.6 oz (80.8 kg)    Lab Results  Component Value Date   TSH 1.420 09/07/2022   Lab Results  Component Value Date   WBC 5.2 09/28/2022   HGB 14.9 09/28/2022   HCT 44.4 09/28/2022   MCV 94 09/28/2022   PLT 261 09/28/2022   Lab Results  Component Value Date   NA 140 09/28/2022   K 3.4 (L) 09/28/2022   CO2 27 09/28/2022   GLUCOSE 96 09/28/2022   BUN 12 09/28/2022   CREATININE 1.15 09/28/2022   BILITOT 0.6 09/07/2022   ALKPHOS 99 09/07/2022   AST 26 09/07/2022   ALT 35 09/07/2022   PROT 7.6 09/07/2022   ALBUMIN 4.4 09/07/2022   CALCIUM 10.2 09/28/2022   ANIONGAP 9 05/12/2020   EGFR 72 09/28/2022   Lab Results  Component Value Date   CHOL 146 09/07/2022   Lab Results  Component Value Date   HDL 47 09/07/2022   Lab Results  Component Value Date   LDLCALC 85 09/07/2022   Lab Results  Component Value Date   TRIG 70 09/07/2022   Lab Results  Component Value Date   CHOLHDL 3.1 09/07/2022   Lab Results  Component Value Date   HGBA1C 5.5 09/07/2022      Assessment & Plan:  Hypertension, essential Assessment & Plan: Uncontrolled He takes olmesartan-amlodipine-hydrochlorothiazide 40-10-12.5 Reports that he has been out of his blood pressure medication since yesterday Denies headaches, dizziness, blurred vision, chest pain, palpitation, shortness of breath Refill sent to the pharmacy Low-sodium diet with increased physical activity encourage We will follow-up in 2 weeks BP Readings from Last 3 Encounters:  10/30/22 (!) 140/82  09/28/22 (!) 152/98  09/07/22 (!) 140/90       Orders: -     Olmesartan-amLODIPine-HCTZ; Take 1 tablet by mouth daily.  Dispense: 30 tablet; Refill: 1    Follow-up: Return in about 2 weeks (around 11/13/2022) for BP.   Alvira Monday, FNP

## 2022-10-30 NOTE — Progress Notes (Signed)
   Acute Office Visit  Subjective:     Patient ID: Antonio Small, male    DOB: [birth year], 62 y.o.   MRN: [id number]  Chief Complaint  Patient presents with   Follow-up    1 month f/u for htn. Ran out of bp medication will be going to pick it up this morning.     HPI Patient is in today for 1 month BP check. Patient reports compliance with medications, increased physical activity and improved diet with less sodium and processed foods. Patient states running out of BP medication 2 days ago.    Review of Systems  Constitutional: Negative.   HENT: Negative.    Eyes: Negative.   Respiratory: Negative.    Cardiovascular: Negative.   Skin: Negative.   Neurological: Negative.   Psychiatric/Behavioral: Negative.          Objective:    BP (!) 140/82 (BP Location: Left Arm)   Pulse 87   Ht 5\' 8"  (1.727 m)   Wt 185 lb 0.6 oz (83.9 kg)   SpO2 96%   BMI 28.14 kg/m  BP Readings from Last 3 Encounters:  10/30/22 (!) 140/82  09/28/22 (!) 152/98  09/07/22 (!) 140/90      Physical Exam Constitutional:      Appearance: Normal appearance.  HENT:     Head: Normocephalic.  Cardiovascular:     Rate and Rhythm: Normal rate and regular rhythm.  Pulmonary:     Effort: Pulmonary effort is normal.  Neurological:     General: No focal deficit present.     Mental Status: He is oriented to person, place, and time. Mental status is at baseline.  Psychiatric:        Mood and Affect: Mood normal.        Behavior: Behavior normal.     No results found for any visits on 10/30/22.      Assessment & Plan:   Problem List Items Addressed This Visit       Cardiovascular and Mediastinum   Hypertension, essential   Relevant Medications   Olmesartan-amLODIPine-HCTZ 40-10-12.5 MG TABS    Meds ordered this encounter  Medications   Olmesartan-amLODIPine-HCTZ 40-10-12.5 MG TABS    Sig: Take 1 tablet by mouth daily.    Dispense:  30 tablet    Refill:  1   Patient to follow-up in  2 weeks for BP check. Continue heart healthy diet, and 150 min of moderate exercise weekly.   Brandt Loosen, RN

## 2022-11-02 ENCOUNTER — Ambulatory Visit (HOSPITAL_COMMUNITY): Admission: RE | Admit: 2022-11-02 | Payer: 59 | Source: Ambulatory Visit

## 2022-11-13 ENCOUNTER — Encounter: Payer: Self-pay | Admitting: Family Medicine

## 2022-11-13 ENCOUNTER — Ambulatory Visit (INDEPENDENT_AMBULATORY_CARE_PROVIDER_SITE_OTHER): Payer: 59 | Admitting: Family Medicine

## 2022-11-13 ENCOUNTER — Other Ambulatory Visit: Payer: Self-pay | Admitting: Family Medicine

## 2022-11-13 VITALS — BP 158/98 | HR 80 | Ht 68.0 in | Wt 187.0 lb

## 2022-11-13 DIAGNOSIS — I10 Essential (primary) hypertension: Secondary | ICD-10-CM

## 2022-11-13 MED ORDER — OLMESARTAN-AMLODIPINE-HCTZ 40-10-25 MG PO TABS: 1.0000 | ORAL_TABLET | Freq: Every day | ORAL | 1 refills | Status: DC

## 2022-11-13 NOTE — Patient Instructions (Addendum)
I appreciate the opportunity to provide care to you today!    Follow up:  1 month for BP    Please continue to a heart-healthy diet and increase your physical activities. Try to exercise for at least five days a week.   Physical activity helps: Lower your blood glucose, improve your heart health, lower your blood pressure and cholesterol, burn calories to help manage her weight, gave you energy, lower stress, and improve his sleep.  The American diabetes Association (ADA) recommends being active for 2-1/2 hours (150 minutes) or more week.  Exercise for 30 minutes, 5 days a week (150 minutes total)    It was a pleasure to see you and I look forward to continuing to work together on your health and well-being. Please do not hesitate to call the office if you need care or have questions about your care.   Have a wonderful day and week. With Gratitude, Gilmore Laroche MSN, FNP-BC

## 2022-11-13 NOTE — Progress Notes (Signed)
Established Patient Office Visit  Subjective:  Patient ID: Antonio Small, male    DOB: 1960/12/05  Age: 62 y.o. MRN: [id number]  CC:  Chief Complaint  Patient presents with   Follow-up    2 week f/u pt reports doing well.    HPI Antonio Small is a 62 y.o. male with past medical history of hypertension presents for blood pressure follow-up. For the details of today's visit, please refer to the assessment and plan.     Past Medical History:  Diagnosis Date   Chronic back pain    Hyperlipidemia    Hypertension     Past Surgical History:  Procedure Laterality Date   BACK SURGERY     HERNIA REPAIR Left    INGUINAL HERNIA REPAIR Right 05/13/2020   Procedure: RIGHT INGUINAL HERNIA REPAIR WITH MESH;  Surgeon: [name], MD;  Location: [facility];  Service: General;  Laterality: Right;   INSERTION OF MESH Right 05/13/2020   Procedure: INSERTION OF MESH;  Surgeon: [name], MD;  Location: [facility];  Service: General;  Laterality: Right;    Family History  Problem Relation Age of Onset   Hypertension Mother    Hypertension Father    Hypertension Sister     Social History   Socioeconomic History   Marital status: Legally Separated    Spouse name: Not on file   Number of children: 1   Years of education: Not on file   Highest education level: Not on file  Occupational History   Occupation: Disabled    Comment: d/t back pain  Tobacco Use   Smoking status: Former    Packs/day: 0.50    Years: 40.00    Additional pack years: 0.00    Total pack years: 20.00    Types: Cigarettes    Quit date: 08/07/2022    Years since quitting: 0.2   Smokeless tobacco: Never  Vaping Use   Vaping Use: Never used  Substance and Sexual Activity   Alcohol use: Not Currently    Comment: occassionally   Drug use: No   Sexual activity: Yes  Other Topics Concern   Not on file  Social History Narrative   1 daughter   Social  Determinants of Health   Financial Resource Strain: Not on file  Food Insecurity: Not on file  Transportation Needs: Not on file  Physical Activity: Not on file  Stress: Not on file  Social Connections: Not on file  Intimate Partner Violence: Not on file    Outpatient Medications Prior to Visit  Medication Sig Dispense Refill   aspirin EC 81 MG tablet Take 81 mg by mouth every morning.     Blood Pressure Monitoring (COMFORT TOUCH BP CUFF/MEDIUM) MISC 1 each by Does not apply route daily. 1 each 0   pravastatin (PRAVACHOL) 40 MG tablet Take 1 tablet (40 mg total) by mouth every morning. 90 tablet 3   Vitamin D, Ergocalciferol, (DRISDOL) 1.25 MG (50000 UNIT) CAPS capsule Take 1 capsule (50,000 Units total) by mouth every 7 (seven) days. 10 capsule 1   Olmesartan-amLODIPine-HCTZ 40-10-12.5 MG TABS Take 1 tablet by mouth daily. 30 tablet 1   No facility-administered medications prior to visit.    No Known Allergies  ROS Review of Systems  Constitutional:  Negative for fatigue and fever.  Eyes:  Negative for visual disturbance.  Respiratory:  Negative for chest tightness and shortness of breath.   Cardiovascular:  Negative for chest pain  and palpitations.  Neurological:  Negative for dizziness and headaches.      Objective:    Physical Exam HENT:     Head: Normocephalic.     Right Ear: External ear normal.     Left Ear: External ear normal.     Nose: No congestion or rhinorrhea.     Mouth/Throat:     Mouth: Mucous membranes are moist.  Cardiovascular:     Rate and Rhythm: Regular rhythm.     Heart sounds: No murmur heard. Pulmonary:     Effort: No respiratory distress.     Breath sounds: Normal breath sounds.  Neurological:     Mental Status: He is alert.     BP (!) 158/98 (BP Location: Right Arm)   Pulse 80   Ht 5\' 8"  (1.727 m)   Wt 187 lb 0.6 oz (84.8 kg)   SpO2 95%   BMI 28.44 kg/m  Wt Readings from Last 3 Encounters:  11/13/22 187 lb 0.6 oz (84.8 kg)   10/30/22 185 lb 0.6 oz (83.9 kg)  09/28/22 183 lb 1.3 oz (83 kg)    Lab Results  Component Value Date   TSH 1.420 09/07/2022   Lab Results  Component Value Date   WBC 5.2 09/28/2022   HGB 14.9 09/28/2022   HCT 44.4 09/28/2022   MCV 94 09/28/2022   PLT 261 09/28/2022   Lab Results  Component Value Date   NA 140 09/28/2022   K 3.4 (L) 09/28/2022   CO2 27 09/28/2022   GLUCOSE 96 09/28/2022   BUN 12 09/28/2022   CREATININE 1.15 09/28/2022   BILITOT 0.6 09/07/2022   ALKPHOS 99 09/07/2022   AST 26 09/07/2022   ALT 35 09/07/2022   PROT 7.6 09/07/2022   ALBUMIN 4.4 09/07/2022   CALCIUM 10.2 09/28/2022   ANIONGAP 9 05/12/2020   EGFR 72 09/28/2022   Lab Results  Component Value Date   CHOL 146 09/07/2022   Lab Results  Component Value Date   HDL 47 09/07/2022   Lab Results  Component Value Date   LDLCALC 85 09/07/2022   Lab Results  Component Value Date   TRIG 70 09/07/2022   Lab Results  Component Value Date   CHOLHDL 3.1 09/07/2022   Lab Results  Component Value Date   HGBA1C 5.5 09/07/2022      Assessment & Plan:  Hypertension, essential Assessment & Plan: Uncontrolled He takes olmesartan-amlodipine-hydrochlorothiazide 40-10-12.5 Denies headaches, dizziness, blurred vision, chest pain, palpitation, shortness of breath Will discontinue olmesartan-amlodipine-hydrochlorothiazide 40-10-12.5 and start the patient on olmesartan-amlodipine-hydrochlorothiazide 40-10-25 milligrams daily Low-sodium diet with increased physical activity encourage We will follow-up in 4 weeks BP Readings from Last 3 Encounters:  11/13/22 (!) 158/98  10/30/22 (!) 140/82  09/28/22 (!) 152/98       Orders: -     Olmesartan-amLODIPine-HCTZ; Take 1 tablet by mouth daily.  Dispense: 30 tablet; Refill: 1    Follow-up: Return in about 1 month (around 12/13/2022).   Gilmore Laroche, FNP

## 2022-11-13 NOTE — Assessment & Plan Note (Signed)
Uncontrolled He takes olmesartan-amlodipine-hydrochlorothiazide 40-10-12.5 Denies headaches, dizziness, blurred vision, chest pain, palpitation, shortness of breath Will discontinue olmesartan-amlodipine-hydrochlorothiazide 40-10-12.5 and start the patient on olmesartan-amlodipine-hydrochlorothiazide 40-10-25 milligrams daily Low-sodium diet with increased physical activity encourage We will follow-up in 4 weeks BP Readings from Last 3 Encounters:  11/13/22 (!) 158/98  10/30/22 (!) 140/82  09/28/22 (!) 152/98

## 2022-12-13 ENCOUNTER — Encounter: Payer: Self-pay | Admitting: Family Medicine

## 2022-12-13 ENCOUNTER — Ambulatory Visit (INDEPENDENT_AMBULATORY_CARE_PROVIDER_SITE_OTHER): Payer: 59 | Admitting: Family Medicine

## 2022-12-13 VITALS — BP 136/86 | HR 87 | Ht 68.0 in | Wt 190.1 lb

## 2022-12-13 DIAGNOSIS — I10 Essential (primary) hypertension: Secondary | ICD-10-CM | POA: Diagnosis not present

## 2022-12-13 MED ORDER — OLMESARTAN-AMLODIPINE-HCTZ 40-10-25 MG PO TABS: 1.0000 | ORAL_TABLET | Freq: Every day | ORAL | 1 refills | Status: DC

## 2022-12-13 NOTE — Progress Notes (Signed)
Established Patient Office Visit  Subjective:  Patient ID: Antonio Small, male    DOB: 1961-07-10  Age: 61 y.o. MRN: [id number]  CC:  Chief Complaint  Patient presents with   Hypertension    1 month f/u.     HPI Antonio Small is a 62 y.o. male presents for blood pressure follow-up. For the details of today's visit, please refer to the assessment and plan.    Past Medical History:  Diagnosis Date   Chronic back pain    Hyperlipidemia    Hypertension     Past Surgical History:  Procedure Laterality Date   BACK SURGERY     HERNIA REPAIR Left    INGUINAL HERNIA REPAIR Right 05/13/2020   Procedure: RIGHT INGUINAL HERNIA REPAIR WITH MESH;  Surgeon: [name], MD;  Location: [facility];  Service: General;  Laterality: Right;   INSERTION OF MESH Right 05/13/2020   Procedure: INSERTION OF MESH;  Surgeon: [name], MD;  Location: [facility];  Service: General;  Laterality: Right;    Family History  Problem Relation Age of Onset   Hypertension Mother    Hypertension Father    Hypertension Sister     Social History   Socioeconomic History   Marital status: Legally Separated    Spouse name: Not on file   Number of children: 1   Years of education: Not on file   Highest education level: Not on file  Occupational History   Occupation: Disabled    Comment: d/t back pain  Tobacco Use   Smoking status: Former    Packs/day: 0.50    Years: 40.00    Additional pack years: 0.00    Total pack years: 20.00    Types: Cigarettes    Quit date: 08/07/2022    Years since quitting: 0.3   Smokeless tobacco: Never  Vaping Use   Vaping Use: Never used  Substance and Sexual Activity   Alcohol use: Not Currently    Comment: occassionally   Drug use: No   Sexual activity: Yes  Other Topics Concern   Not on file  Social History Narrative   1 daughter   Social Determinants of Health   Financial Resource Strain: Not on file   Food Insecurity: Not on file  Transportation Needs: Not on file  Physical Activity: Not on file  Stress: Not on file  Social Connections: Not on file  Intimate Partner Violence: Not on file    Outpatient Medications Prior to Visit  Medication Sig Dispense Refill   aspirin EC 81 MG tablet Take 81 mg by mouth every morning.     Blood Pressure Monitoring (COMFORT TOUCH BP CUFF/MEDIUM) MISC 1 each by Does not apply route daily. 1 each 0   Olmesartan-amLODIPine-HCTZ 40-10-25 MG TABS Take 1 tablet by mouth daily. 30 tablet 1   pravastatin (PRAVACHOL) 40 MG tablet Take 1 tablet (40 mg total) by mouth every morning. (Patient not taking: Reported on 12/13/2022) 90 tablet 3   Vitamin D, Ergocalciferol, (DRISDOL) 1.25 MG (50000 UNIT) CAPS capsule Take 1 capsule (50,000 Units total) by mouth every 7 (seven) days. (Patient not taking: Reported on 12/13/2022) 10 capsule 1   No facility-administered medications prior to visit.    No Known Allergies  ROS Review of Systems  Constitutional:  Negative for fatigue and fever.  Eyes:  Negative for visual disturbance.  Respiratory:  Negative for chest tightness and shortness of breath.   Cardiovascular:  Negative for  chest pain and palpitations.  Neurological:  Negative for dizziness and headaches.      Objective:    Physical Exam HENT:     Head: Normocephalic.     Right Ear: External ear normal.     Left Ear: External ear normal.     Nose: No congestion or rhinorrhea.     Mouth/Throat:     Mouth: Mucous membranes are moist.  Cardiovascular:     Rate and Rhythm: Regular rhythm.     Heart sounds: No murmur heard. Pulmonary:     Effort: No respiratory distress.     Breath sounds: Normal breath sounds.  Neurological:     Mental Status: He is alert.     BP 136/86   Pulse 87   Ht 5\' 8"  (1.727 m)   Wt 190 lb 1.9 oz (86.2 kg)   SpO2 95%   BMI 28.91 kg/m  Wt Readings from Last 3 Encounters:  12/13/22 190 lb 1.9 oz (86.2 kg)  11/13/22 187  lb 0.6 oz (84.8 kg)  10/30/22 185 lb 0.6 oz (83.9 kg)    Lab Results  Component Value Date   TSH 1.420 09/07/2022   Lab Results  Component Value Date   WBC 5.2 09/28/2022   HGB 14.9 09/28/2022   HCT 44.4 09/28/2022   MCV 94 09/28/2022   PLT 261 09/28/2022   Lab Results  Component Value Date   NA 140 09/28/2022   K 3.4 (L) 09/28/2022   CO2 27 09/28/2022   GLUCOSE 96 09/28/2022   BUN 12 09/28/2022   CREATININE 1.15 09/28/2022   BILITOT 0.6 09/07/2022   ALKPHOS 99 09/07/2022   AST 26 09/07/2022   ALT 35 09/07/2022   PROT 7.6 09/07/2022   ALBUMIN 4.4 09/07/2022   CALCIUM 10.2 09/28/2022   ANIONGAP 9 05/12/2020   EGFR 72 09/28/2022   Lab Results  Component Value Date   CHOL 146 09/07/2022   Lab Results  Component Value Date   HDL 47 09/07/2022   Lab Results  Component Value Date   LDLCALC 85 09/07/2022   Lab Results  Component Value Date   TRIG 70 09/07/2022   Lab Results  Component Value Date   CHOLHDL 3.1 09/07/2022   Lab Results  Component Value Date   HGBA1C 5.5 09/07/2022      Assessment & Plan:  Hypertension, essential Assessment & Plan: Stable Asymptomatic in the clinic Low-sodium diet with increased physical activity encouraged BP Readings from Last 3 Encounters:  12/13/22 136/86  11/13/22 (!) 158/98  10/30/22 (!) 140/82        Orders: -     Olmesartan-amLODIPine-HCTZ; Take 1 tablet by mouth daily.  Dispense: 90 tablet; Refill: 1 -     BMP8+EGFR    Follow-up: Return in about 3 months (around 03/15/2023).   Gilmore Laroche, FNP

## 2022-12-13 NOTE — Patient Instructions (Addendum)
I appreciate the opportunity to provide care to you today!    Follow up:  3 months  Labs: please stop by the lab today to get your blood drawn (BPM    Please continue to a heart-healthy diet and increase your physical activities. Try to exercise for at least five days a week.      It was a pleasure to see you and I look forward to continuing to work together on your health and well-being. Please do not hesitate to call the office if you need care or have questions about your care.   Have a wonderful day and week. With Gratitude, Gilmore Laroche MSN, FNP-BC

## 2022-12-13 NOTE — Assessment & Plan Note (Signed)
Stable Asymptomatic in the clinic Low-sodium diet with increased physical activity encouraged BP Readings from Last 3 Encounters:  12/13/22 136/86  11/13/22 (!) 158/98  10/30/22 (!) 140/82

## 2022-12-14 LAB — BMP8+EGFR
BUN/Creatinine Ratio: 13 (ref 10–24)
BUN: 14 mg/dL (ref 8–27)
CO2: 27 mmol/L (ref 20–29)
Calcium: 10 mg/dL (ref 8.6–10.2)
Chloride: 99 mmol/L (ref 96–106)
Creatinine, Ser: 1.12 mg/dL (ref 0.76–1.27)
Glucose: 142 mg/dL — ABNORMAL HIGH (ref 70–99)
Potassium: 3.5 mmol/L (ref 3.5–5.2)
Sodium: 139 mmol/L (ref 134–144)
eGFR: 75 mL/min/{1.73_m2} (ref >59)

## 2022-12-14 NOTE — Progress Notes (Signed)
Please inform the patient that his kidney and potassium levels are stable

## 2022-12-21 ENCOUNTER — Other Ambulatory Visit: Payer: Self-pay

## 2022-12-21 ENCOUNTER — Telehealth: Payer: Self-pay | Admitting: Family Medicine

## 2022-12-21 DIAGNOSIS — I10 Essential (primary) hypertension: Secondary | ICD-10-CM

## 2022-12-21 MED ORDER — OLMESARTAN-AMLODIPINE-HCTZ 40-10-25 MG PO TABS: 1.0000 | ORAL_TABLET | Freq: Every day | ORAL | 1 refills | Status: DC

## 2022-12-21 NOTE — Telephone Encounter (Signed)
Refill sent.

## 2022-12-21 NOTE — Telephone Encounter (Signed)
Patient called has not had in 3 days, said the pharmacy faxed over to our office not received it yet.   Prescription Request  12/21/2022  LOV: 12/13/2022  What is the name of the medication or equipment? Olmesartan-amLODIPine-HCTZ 40-10-25 MG TABS   Have you contacted your pharmacy to request a refill? Yes   Which pharmacy would you like this sent to?  [id number] - [place], [place] - [address] [place] [address] Phone: [phone] Fax: [phone]    Patient notified that their request is being sent to the clinical staff for review and that they should receive a response within 2 business days.   Please advise at Mobile [phone] (mobile)

## 2022-12-25 ENCOUNTER — Other Ambulatory Visit: Payer: Self-pay

## 2022-12-25 ENCOUNTER — Telehealth: Payer: Self-pay | Admitting: Family Medicine

## 2022-12-25 DIAGNOSIS — I10 Essential (primary) hypertension: Secondary | ICD-10-CM

## 2022-12-25 MED ORDER — OLMESARTAN-AMLODIPINE-HCTZ 40-10-25 MG PO TABS: 1.0000 | ORAL_TABLET | Freq: Every day | ORAL | 1 refills | Status: DC

## 2022-12-25 NOTE — Telephone Encounter (Signed)
Prescription Request  12/25/2022  LOV: 12/13/2022  What is the name of the medication or equipment? Olmesartan-amLODIPine-HCTZ 40-10-25 MG TABS   Have you contacted your pharmacy to request a refill? Yes   Which pharmacy would you like this sent to?  [id number] - [address], [address] [place] - [address] [place] [address] Phone: [phone] Fax: [phone]    Patient notified that their request is being sent to the clinical staff for review and that they should receive a response within 2 business days.   Please advise at Mobile [phone] (mobile)    Pharm has not received refill request

## 2022-12-25 NOTE — Telephone Encounter (Signed)
Refills sent

## 2023-01-04 ENCOUNTER — Telehealth: Payer: Self-pay | Admitting: Family Medicine

## 2023-01-04 NOTE — Telephone Encounter (Signed)
Returned call vm not set up

## 2023-01-04 NOTE — Telephone Encounter (Signed)
Second attempt

## 2023-01-04 NOTE — Telephone Encounter (Signed)
Pt called in regard to Olmesartan-amLODIPine-HCTZ 40-10-25 MG TABS   States that provider needs to reach out to pharm. Pharm does not have the meds and is trying to give patient 3 different medications instead. Patient also wants a call back in regard. Patient has not had medication to take.

## 2023-01-05 NOTE — Telephone Encounter (Signed)
Multiple attempts to contact pt.

## 2023-01-07 ENCOUNTER — Other Ambulatory Visit: Payer: Self-pay | Admitting: Family Medicine

## 2023-01-08 ENCOUNTER — Other Ambulatory Visit: Payer: Self-pay

## 2023-01-08 ENCOUNTER — Other Ambulatory Visit: Payer: Self-pay | Admitting: Family Medicine

## 2023-01-08 DIAGNOSIS — I10 Essential (primary) hypertension: Secondary | ICD-10-CM

## 2023-01-08 MED ORDER — AMLODIPINE BESYLATE 10 MG PO TABS
10.0000 mg | ORAL_TABLET | Freq: Every day | ORAL | 3 refills | Status: DC
Start: 2023-01-08 — End: 2023-08-28

## 2023-01-08 MED ORDER — HYDROCHLOROTHIAZIDE 25 MG PO TABS
25.0000 mg | ORAL_TABLET | Freq: Every day | ORAL | 3 refills | Status: DC
Start: 2023-01-08 — End: 2023-09-17

## 2023-01-08 MED ORDER — OLMESARTAN MEDOXOMIL 40 MG PO TABS
40.0000 mg | ORAL_TABLET | Freq: Every day | ORAL | 3 refills | Status: DC
Start: 2023-01-08 — End: 2023-09-17

## 2023-01-08 NOTE — Telephone Encounter (Signed)
I discontinued the combination medication and sent the medications as 3 separate orders.  The prescription is sent to the pharmacy

## 2023-03-15 ENCOUNTER — Other Ambulatory Visit: Payer: Self-pay | Admitting: Family Medicine

## 2023-03-15 ENCOUNTER — Encounter: Payer: Self-pay | Admitting: Family Medicine

## 2023-03-15 ENCOUNTER — Ambulatory Visit (INDEPENDENT_AMBULATORY_CARE_PROVIDER_SITE_OTHER): Payer: 59 | Admitting: Family Medicine

## 2023-03-15 VITALS — BP 142/90 | HR 87 | Ht 68.0 in | Wt 197.0 lb

## 2023-03-15 DIAGNOSIS — I10 Essential (primary) hypertension: Secondary | ICD-10-CM

## 2023-03-15 MED ORDER — HYDROXYZINE HCL 10 MG PO TABS
10.0000 mg | ORAL_TABLET | Freq: Two times a day (BID) | ORAL | 1 refills | Status: DC
Start: 2023-03-15 — End: 2023-03-15

## 2023-03-15 MED ORDER — HYDRALAZINE HCL 10 MG PO TABS
10.0000 mg | ORAL_TABLET | Freq: Three times a day (TID) | ORAL | 1 refills | Status: DC
Start: 2023-03-15 — End: 2023-06-04

## 2023-03-15 NOTE — Patient Instructions (Addendum)
I appreciate the opportunity to provide care to you today!    Follow up:  1 months  Labs: please stop by the lab today to get your blood drawn (CBC, CMP, TSH, Lipid profile, HgA1c, Vit D)  Please schedule Medicare Annual Wellness visit.  Hypertension Please take olmesartan 40 mg daily. Amlodipine 10 mg daily, Hydrochlorothiazide 25 mg daily and Hydralazine 10 mg BID -check your BP daily and bring your reading with your at your follow up appointment -I want your BP <140/90 High blood pressure often has no symptoms; it is important to take your medication as prescribed In the first few days of therapy, you may experience dizziness/lightheadedness from your body adjusting to lower blood pressure (this is expected) Please stay well-hydrated, at least 64 ounces of water daily I recommend a low-sodium diet, less than 1500 mg daily, with increased physical activity Avoid using over-the-counter NSAIDs (ibuprofen, naproxen) while on this therapy Avoid using sodium use in your diet Please increase your servings of fruits and vegetables     Referrals today- Advanced Hypertension clinic   Attached with your AVS, you will find valuable resources for self-education. I highly recommend dedicating some time to thoroughly examine them.   Please continue to a heart-healthy diet and increase your physical activities. Try to exercise for at least five days a week.    It was a pleasure to see you and I look forward to continuing to work together on your health and well-being. Please do not hesitate to call the office if you need care or have questions about your care.  In case of emergency, please visit the Emergency Department for urgent care, or contact our clinic at [phone] to schedule an appointment. We're here to help you!   Have a wonderful day and week. With Gratitude, Gilmore Laroche MSN, FNP-BC

## 2023-03-15 NOTE — Progress Notes (Signed)
Established Patient Office Visit  Subjective:  Patient ID: Antonio Small, male    DOB: 1961/01/17  Age: 62 y.o. MRN: [id number]  CC:  Chief Complaint  Patient presents with   Hypertension    Following up on bp.     HPI Antonio Small is a 62 y.o. male with past medical history of uncontrolled hypertension presents for f/u of Hypertension. For the details of today's visit, please refer to the assessment and plan.     Past Medical History:  Diagnosis Date   Chronic back pain    Hyperlipidemia    Hypertension     Past Surgical History:  Procedure Laterality Date   BACK SURGERY     HERNIA REPAIR Left    INGUINAL HERNIA REPAIR Right 05/13/2020   Procedure: RIGHT INGUINAL HERNIA REPAIR WITH MESH;  Surgeon: [name], MD;  Location: [facility];  Service: General;  Laterality: Right;   INSERTION OF MESH Right 05/13/2020   Procedure: INSERTION OF MESH;  Surgeon: [name], MD;  Location: [facility];  Service: General;  Laterality: Right;    Family History  Problem Relation Age of Onset   Hypertension Mother    Hypertension Father    Hypertension Sister     Social History   Socioeconomic History   Marital status: Legally Separated    Spouse name: Not on file   Number of children: 1   Years of education: Not on file   Highest education level: Not on file  Occupational History   Occupation: Disabled    Comment: d/t back pain  Tobacco Use   Smoking status: Former    Current packs/day: 0.00    Average packs/day: 0.5 packs/day for 40.0 years (20.0 ttl pk-yrs)    Types: Cigarettes    Start date: 08/07/1982    Quit date: 08/07/2022    Years since quitting: 0.6   Smokeless tobacco: Never  Vaping Use   Vaping status: Never Used  Substance and Sexual Activity   Alcohol use: Not Currently    Comment: occassionally   Drug use: No   Sexual activity: Yes  Other Topics Concern   Not on file  Social History Narrative   1  daughter   Social Determinants of Health   Financial Resource Strain: Not on file  Food Insecurity: Not on file  Transportation Needs: Not on file  Physical Activity: Not on file  Stress: Not on file  Social Connections: Not on file  Intimate Partner Violence: Not on file    Outpatient Medications Prior to Visit  Medication Sig Dispense Refill   amLODipine (NORVASC) 10 MG tablet Take 1 tablet (10 mg total) by mouth daily. 60 tablet 3   aspirin EC 81 MG tablet Take 81 mg by mouth every morning.     Blood Pressure Monitoring (COMFORT TOUCH BP CUFF/MEDIUM) MISC 1 each by Does not apply route daily. 1 each 0   hydrochlorothiazide (HYDRODIURIL) 25 MG tablet Take 1 tablet (25 mg total) by mouth daily. 60 tablet 3   olmesartan (BENICAR) 40 MG tablet Take 1 tablet (40 mg total) by mouth daily. 60 tablet 3   Vitamin D, Ergocalciferol, (DRISDOL) 1.25 MG (50000 UNIT) CAPS capsule Take 1 capsule (50,000 Units total) by mouth every 7 (seven) days. 10 capsule 1   pravastatin (PRAVACHOL) 40 MG tablet Take 1 tablet (40 mg total) by mouth every morning. 90 tablet 3   No facility-administered medications prior to visit.  No Known Allergies  ROS Review of Systems  Constitutional:  Negative for fatigue and fever.  Eyes:  Negative for visual disturbance.  Respiratory:  Negative for chest tightness and shortness of breath.   Cardiovascular:  Negative for chest pain and palpitations.  Neurological:  Negative for dizziness and headaches.      Objective:    Physical Exam HENT:     Head: Normocephalic.     Right Ear: External ear normal.     Left Ear: External ear normal.     Nose: No congestion or rhinorrhea.     Mouth/Throat:     Mouth: Mucous membranes are moist.  Cardiovascular:     Rate and Rhythm: Regular rhythm.     Heart sounds: No murmur heard. Pulmonary:     Effort: No respiratory distress.     Breath sounds: Normal breath sounds.  Neurological:     Mental Status: He is  alert.     BP (!) 142/90 (BP Location: Left Arm)   Pulse 87   Ht 5\' 8"  (1.727 m)   Wt 197 lb (89.4 kg)   SpO2 94%   BMI 29.95 kg/m  Wt Readings from Last 3 Encounters:  03/15/23 197 lb (89.4 kg)  12/13/22 190 lb 1.9 oz (86.2 kg)  11/13/22 187 lb 0.6 oz (84.8 kg)    Lab Results  Component Value Date   TSH 1.420 09/07/2022   Lab Results  Component Value Date   WBC 5.2 09/28/2022   HGB 14.9 09/28/2022   HCT 44.4 09/28/2022   MCV 94 09/28/2022   PLT 261 09/28/2022   Lab Results  Component Value Date   NA 139 12/13/2022   K 3.5 12/13/2022   CO2 27 12/13/2022   GLUCOSE 142 (H) 12/13/2022   BUN 14 12/13/2022   CREATININE 1.12 12/13/2022   BILITOT 0.6 09/07/2022   ALKPHOS 99 09/07/2022   AST 26 09/07/2022   ALT 35 09/07/2022   PROT 7.6 09/07/2022   ALBUMIN 4.4 09/07/2022   CALCIUM 10.0 12/13/2022   ANIONGAP 9 05/12/2020   EGFR 75 12/13/2022   Lab Results  Component Value Date   CHOL 146 09/07/2022   Lab Results  Component Value Date   HDL 47 09/07/2022   Lab Results  Component Value Date   LDLCALC 85 09/07/2022   Lab Results  Component Value Date   TRIG 70 09/07/2022   Lab Results  Component Value Date   CHOLHDL 3.1 09/07/2022   Lab Results  Component Value Date   HGBA1C 5.5 09/07/2022      Assessment & Plan:  Hypertension, essential Assessment & Plan: Uncontrolled Reports treatment adherence with olmesartan 40 mg daily, Amlodipine 10 mg daily, Hydrochlorothiazide 25 mg daily Will start the patient on hydralazine 10 mg BID Referral placed to Advanced hypertension clinic Low-sodium diet with increased physical activity encouraged Discussed symptomatic hypotension after starting therapy Encouraged to stay hydrated, at least 64 ounces daily BP Readings from Last 3 Encounters:  03/15/23 (!) 142/90  12/13/22 136/86  11/13/22 (!) 158/98        Orders: -     Ambulatory referral to Advanced Hypertension Clinic -     hydrALAZINE HCl; Take 1  tablet (10 mg total) by mouth 3 (three) times daily.  Dispense: 90 tablet; Refill: 1   Note: This chart has been completed using Engineer, civil (consulting) software, and while attempts have been made to ensure accuracy, certain words and phrases may not be transcribed as intended.  Follow-up: Return in about 1 month (around 04/15/2023).   Gilmore Laroche, FNP

## 2023-03-15 NOTE — Assessment & Plan Note (Signed)
Uncontrolled Reports treatment adherence with olmesartan 40 mg daily, Amlodipine 10 mg daily, Hydrochlorothiazide 25 mg daily Will start the patient on hydralazine 10 mg BID Referral placed to Advanced hypertension clinic Low-sodium diet with increased physical activity encouraged Discussed symptomatic hypotension after starting therapy Encouraged to stay hydrated, at least 64 ounces daily BP Readings from Last 3 Encounters:  03/15/23 (!) 142/90  12/13/22 136/86  11/13/22 (!) 158/98

## 2023-04-17 ENCOUNTER — Ambulatory Visit (INDEPENDENT_AMBULATORY_CARE_PROVIDER_SITE_OTHER): Payer: 59 | Admitting: Family Medicine

## 2023-04-17 ENCOUNTER — Encounter: Payer: Self-pay | Admitting: Family Medicine

## 2023-04-17 VITALS — BP 142/88 | HR 83 | Ht 68.0 in | Wt 192.1 lb

## 2023-04-17 DIAGNOSIS — I10 Essential (primary) hypertension: Secondary | ICD-10-CM | POA: Diagnosis not present

## 2023-04-17 DIAGNOSIS — Z23 Encounter for immunization: Secondary | ICD-10-CM

## 2023-04-17 NOTE — Progress Notes (Unsigned)
Established Patient Office Visit  Subjective:  Patient ID: Antonio Small, male    DOB: [birth year]  Age: 62 y.o. MRN: [id number]  CC:  Chief Complaint  Patient presents with   Care Management    1 month f/u    HPI Antonio Small is a 62 y.o. male with past medical history of *** presents for f/u of *** chronic medical conditions.    BP Readings from Last 3 Encounters:  04/17/23 (!) 142/88  03/15/23 (!) 142/90  12/13/22 136/86     Past Medical History:  Diagnosis Date   Chronic back pain    Hyperlipidemia    Hypertension     Past Surgical History:  Procedure Laterality Date   BACK SURGERY     HERNIA REPAIR Left    INGUINAL HERNIA REPAIR Right 05/13/2020   Procedure: RIGHT INGUINAL HERNIA REPAIR WITH MESH;  Surgeon: [name], MD;  Location: [facility];  Service: General;  Laterality: Right;   INSERTION OF MESH Right 05/13/2020   Procedure: INSERTION OF MESH;  Surgeon: [name], MD;  Location: [facility];  Service: General;  Laterality: Right;    Family History  Problem Relation Age of Onset   Hypertension Mother    Hypertension Father    Hypertension Sister     Social History   Socioeconomic History   Marital status: Legally Separated    Spouse name: Not on file   Number of children: 1   Years of education: Not on file   Highest education level: Not on file  Occupational History   Occupation: Disabled    Comment: d/t back pain  Tobacco Use   Smoking status: Former    Current packs/day: 0.00    Average packs/day: 0.5 packs/day for 40.0 years (20.0 ttl pk-yrs)    Types: Cigarettes    Start date: 08/07/1982    Quit date: 08/07/2022    Years since quitting: 0.6   Smokeless tobacco: Never  Vaping Use   Vaping status: Never Used  Substance and Sexual Activity   Alcohol use: Not Currently    Comment: occassionally   Drug use: No   Sexual activity: Yes  Other Topics Concern   Not on file  Social  History Narrative   1 daughter   Social Determinants of Health   Financial Resource Strain: Not on file  Food Insecurity: Not on file  Transportation Needs: Not on file  Physical Activity: Not on file  Stress: Not on file  Social Connections: Not on file  Intimate Partner Violence: Not on file    Outpatient Medications Prior to Visit  Medication Sig Dispense Refill   amLODipine (NORVASC) 10 MG tablet Take 1 tablet (10 mg total) by mouth daily. 60 tablet 3   aspirin EC 81 MG tablet Take 81 mg by mouth every morning.     Blood Pressure Monitoring (COMFORT TOUCH BP CUFF/MEDIUM) MISC 1 each by Does not apply route daily. 1 each 0   hydrALAZINE (APRESOLINE) 10 MG tablet Take 1 tablet (10 mg total) by mouth 3 (three) times daily. 90 tablet 1   hydrochlorothiazide (HYDRODIURIL) 25 MG tablet Take 1 tablet (25 mg total) by mouth daily. 60 tablet 3   olmesartan (BENICAR) 40 MG tablet Take 1 tablet (40 mg total) by mouth daily. 60 tablet 3   Vitamin D, Ergocalciferol, (DRISDOL) 1.25 MG (50000 UNIT) CAPS capsule Take 1 capsule (50,000 Units total) by mouth every 7 (seven) days. 10 capsule 1  No facility-administered medications prior to visit.    No Known Allergies  ROS Review of Systems    Objective:    Physical Exam  BP (!) 142/88 (BP Location: Left Arm)   Pulse 83   Ht 5\' 8"  (1.727 m)   Wt 192 lb 1.9 oz (87.1 kg)   SpO2 95%   BMI 29.21 kg/m  Wt Readings from Last 3 Encounters:  04/17/23 192 lb 1.9 oz (87.1 kg)  03/15/23 197 lb (89.4 kg)  12/13/22 190 lb 1.9 oz (86.2 kg)    Lab Results  Component Value Date   TSH 1.420 09/07/2022   Lab Results  Component Value Date   WBC 5.2 09/28/2022   HGB 14.9 09/28/2022   HCT 44.4 09/28/2022   MCV 94 09/28/2022   PLT 261 09/28/2022   Lab Results  Component Value Date   NA 139 12/13/2022   K 3.5 12/13/2022   CO2 27 12/13/2022   GLUCOSE 142 (H) 12/13/2022   BUN 14 12/13/2022   CREATININE 1.12 12/13/2022   BILITOT 0.6  09/07/2022   ALKPHOS 99 09/07/2022   AST 26 09/07/2022   ALT 35 09/07/2022   PROT 7.6 09/07/2022   ALBUMIN 4.4 09/07/2022   CALCIUM 10.0 12/13/2022   ANIONGAP 9 05/12/2020   EGFR 75 12/13/2022   Lab Results  Component Value Date   CHOL 146 09/07/2022   Lab Results  Component Value Date   HDL 47 09/07/2022   Lab Results  Component Value Date   LDLCALC 85 09/07/2022   Lab Results  Component Value Date   TRIG 70 09/07/2022   Lab Results  Component Value Date   CHOLHDL 3.1 09/07/2022   Lab Results  Component Value Date   HGBA1C 5.5 09/07/2022      Assessment & Plan:  Encounter for immunization -     Flu vaccine trivalent PF, 6mos and older(Flulaval,Afluria,Fluarix,Fluzone)    Follow-up: No follow-ups on file.   Gilmore Laroche, FNP

## 2023-04-17 NOTE — Patient Instructions (Addendum)
I appreciate the opportunity to provide care to you today!    Follow up:  1 months  Labs: please stop by the lab today to get your blood drawn (BMP)  Hypertension Management Your current blood pressure exceeds the target goal of <140/90 mmHg. To manage this, please continue with the following medication regimen: Amlodipine 10 mg daily, Olmesartan 40 mg daily, Hydrochlorothiazide 25 mg daily, and Hydralazine 10 mg three times daily.  Medication Instructions: Take your blood pressure medication at the same time each day. After taking your medication, check your blood pressure at least an hour later. If your first reading is >140/90 mmHg, wait at least 10 minutes and recheck your blood pressure. Diet and Lifestyle: Adhere to a low-sodium diet, limiting intake to less than 1500 mg daily, and increase your physical activity. Avoid over-the-counter NSAIDs such as ibuprofen and naproxen while on this medication. Hydration and Nutrition: Stay well-hydrated by drinking at least 64 ounces of water daily. Increase your servings of fruits and vegetables and avoid excessive sodium in your diet. Long-Term Considerations: Uncontrolled hypertension can increase the risk of cardiovascular diseases, including stroke, coronary artery disease, and heart failure.    Please continue to a heart-healthy diet and increase your physical activities. Try to exercise for at least five days a week.    It was a pleasure to see you and I look forward to continuing to work together on your health and well-being. Please do not hesitate to call the office if you need care or have questions about your care.  In case of emergency, please visit the Emergency Department for urgent care, or contact our clinic at [phone] to schedule an appointment. We're here to help you!   Have a wonderful day and week. With Gratitude, Gilmore Laroche MSN, FNP-BC

## 2023-04-18 LAB — BMP8+EGFR
BUN/Creatinine Ratio: 9 — ABNORMAL LOW (ref 10–24)
BUN: 11 mg/dL (ref 8–27)
CO2: 29 mmol/L (ref 20–29)
Calcium: 10.4 mg/dL — ABNORMAL HIGH (ref 8.6–10.2)
Chloride: 99 mmol/L (ref 96–106)
Creatinine, Ser: 1.18 mg/dL (ref 0.76–1.27)
Glucose: 105 mg/dL — ABNORMAL HIGH (ref 70–99)
Potassium: 3.6 mmol/L (ref 3.5–5.2)
Sodium: 142 mmol/L (ref 134–144)
eGFR: 70 mL/min/{1.73_m2} (ref >59)

## 2023-04-18 NOTE — Assessment & Plan Note (Signed)
The patient reports adherence to a low-sodium diet and increased physical activity. At this time, no changes will be made to his treatment regimen until his follow-up with the hypertensive clinic. He was encouraged to seek immediate medical attention by reporting to the emergency department if his blood pressure exceeds 180/120 mmHg and is accompanied by symptoms such as headaches, dizziness, blurred vision, chest pain, or palpitations. The patient verbalized understanding of these instructions.

## 2023-04-19 NOTE — Progress Notes (Signed)
Please inform the patient that his renal function is stable

## 2023-05-10 ENCOUNTER — Institutional Professional Consult (permissible substitution) (HOSPITAL_BASED_OUTPATIENT_CLINIC_OR_DEPARTMENT_OTHER): Payer: 59 | Admitting: Family

## 2023-05-10 NOTE — Progress Notes (Deleted)
Advanced Hypertension Clinic Initial Assessment:    Date:  05/10/2023   ID:  VIRAAJ VORNDRAN, DOB 12-25-1960, MRN [id number]  PCP:  Gilmore Laroche, FNP  Cardiologist:  None  Nephrologist:  Referring MD: Gilmore Laroche, FNP   CC: Hypertension  History of Present Illness:    Antonio Small is a 62 y.o. male with a hx of hypertension, vitamin D deficiency here to establish care in the Advanced Hypertension Clinic.   Presently on Amlodipine 10mg  daily, Hydralazine 10mg  TID, Olmesartan 40mg  daily, hydrochlorothiazide 25mg  daily. ***  Previous antihypertensives:  Secondary Causes of Hypertension  Medications/Herbal: OCP, steroids, stimulants, antidepressants, weight loss medication, immune suppressants, NSAIDs, sympathomimetics, alcohol, caffeine, licorice, ginseng, St. John's wort, chemo  Sleep Apnea Renal artery stenosis Hyperaldosteronism Hyper/hypothyroidism Pheochromocytoma: CT abd 2021 normal adrenals Cushing's syndrome: Cushingoid facies, central obesity, proximal muscle weakness, and ecchymoses, adrenal incidentaloma (cortisol) Coarctation of the aorta  Past Medical History:  Diagnosis Date   Chronic back pain    Hyperlipidemia    Hypertension     Past Surgical History:  Procedure Laterality Date   BACK SURGERY     HERNIA REPAIR Left    INGUINAL HERNIA REPAIR Right 05/13/2020   Procedure: RIGHT INGUINAL HERNIA REPAIR WITH MESH;  Surgeon: [name], MD;  Location: [facility];  Service: General;  Laterality: Right;   INSERTION OF MESH Right 05/13/2020   Procedure: INSERTION OF MESH;  Surgeon: [name], MD;  Location: [facility];  Service: General;  Laterality: Right;    Current Medications: No outpatient medications have been marked as taking for the 05/10/23 encounter (Appointment) with Alver Sorrow, NP.     Allergies:   Patient has no known allergies.   Social History   Socioeconomic History    Marital status: Legally Separated    Spouse name: Not on file   Number of children: 1   Years of education: Not on file   Highest education level: Not on file  Occupational History   Occupation: Disabled    Comment: d/t back pain  Tobacco Use   Smoking status: Former    Current packs/day: 0.00    Average packs/day: 0.5 packs/day for 40.0 years (20.0 ttl pk-yrs)    Types: Cigarettes    Start date: 08/07/1982    Quit date: 08/07/2022    Years since quitting: 0.7   Smokeless tobacco: Never  Vaping Use   Vaping status: Never Used  Substance and Sexual Activity   Alcohol use: Not Currently    Comment: occassionally   Drug use: No   Sexual activity: Yes  Other Topics Concern   Not on file  Social History Narrative   1 daughter   Social Determinants of Health   Financial Resource Strain: Not on file  Food Insecurity: Not on file  Transportation Needs: Not on file  Physical Activity: Not on file  Stress: Not on file  Social Connections: Not on file     Family History: The patient's ***family history includes Hypertension in his father, mother, and sister.  ROS:   Please see the history of present illness.    *** All other systems reviewed and are negative.  EKGs/Labs/Other Studies Reviewed:    EKG:  EKG is *** ordered today.  The ekg ordered today demonstrates ***  Recent Labs: 09/07/2022: ALT 35; TSH 1.420 09/28/2022: Hemoglobin 14.9; Platelets 261 04/17/2023: BUN 11; Creatinine, Ser 1.18; Potassium 3.6; Sodium 142   Recent Lipid Panel    Component  Value Date/Time   CHOL 146 09/07/2022 0835   TRIG 70 09/07/2022 0835   HDL 47 09/07/2022 0835   CHOLHDL 3.1 09/07/2022 0835   CHOLHDL 5.3 Ratio 10/12/2006 2139   VLDL 24 10/12/2006 2139   LDLCALC 85 09/07/2022 0835    Physical Exam:   VS:  There were no vitals taken for this visit. , BMI There is no height or weight on file to calculate BMI. GENERAL:  Well appearing HEENT: Pupils equal round and reactive, fundi not  visualized, oral mucosa unremarkable NECK:  No jugular venous distention, waveform within normal limits, carotid upstroke brisk and symmetric, no bruits, no thyromegaly LYMPHATICS:  No cervical adenopathy LUNGS:  Clear to auscultation bilaterally HEART:  RRR.  PMI not displaced or sustained,S1 and S2 within normal limits, no S3, no S4, no clicks, no rubs, *** murmurs ABD:  Flat, positive bowel sounds normal in frequency in pitch, no bruits, no rebound, no guarding, no midline pulsatile mass, no hepatomegaly, no splenomegaly EXT:  2 plus pulses throughout, no edema, no cyanosis no clubbing SKIN:  No rashes no nodules NEURO:  Cranial nerves II through XII grossly intact, motor grossly intact throughout PSYCH:  Cognitively intact, oriented to person place and time   ASSESSMENT/PLAN:    No problem-specific Assessment & Plan notes found for this encounter.   Screening for Secondary Hypertension: { Click here to document screening for secondary causes of HTN  :[id number]}    Relevant Labs/Studies:    Latest Ref Rng & Units 04/17/2023   11:10 AM 12/13/2022    8:55 AM 09/28/2022    9:50 AM  Basic Labs  Sodium 134 - 144 mmol/L 142  139  140   Potassium 3.5 - 5.2 mmol/L 3.6  3.5  3.4   Creatinine 0.76 - 1.27 mg/dL 2.99  2.42  6.83        Latest Ref Rng & Units 09/07/2022    8:35 AM  Thyroid   TSH 0.450 - 4.500 uIU/mL 1.420                     he consents to be monitored in our remote patient monitoring program through [employer].  he will track his blood pressure twice daily and understands that these trends will help [place] to adjust his medications as needed prior to his next appointment.  he *** interested in enrolling in the PREP exercise and nutrition program through the [facility]     Disposition:    FU with MD/PharmD in {gen number 4-19:622297} {Days to years:10300}    Medication Adjustments/Labs and Tests Ordered: Current medicines are reviewed at length with the patient today.   Concerns regarding medicines are outlined above.  No orders of the defined types were placed in this encounter.  No orders of the defined types were placed in this encounter.    Signed, Alver Sorrow, NP  05/10/2023 10:47 AM    [facility]

## 2023-05-11 ENCOUNTER — Other Ambulatory Visit: Payer: Self-pay | Admitting: Family Medicine

## 2023-05-11 DIAGNOSIS — Z1211 Encounter for screening for malignant neoplasm of colon: Secondary | ICD-10-CM

## 2023-05-11 DIAGNOSIS — Z1212 Encounter for screening for malignant neoplasm of rectum: Secondary | ICD-10-CM

## 2023-05-17 ENCOUNTER — Encounter: Payer: Self-pay | Admitting: Family Medicine

## 2023-05-17 ENCOUNTER — Ambulatory Visit (INDEPENDENT_AMBULATORY_CARE_PROVIDER_SITE_OTHER): Payer: 59 | Admitting: Family Medicine

## 2023-05-17 VITALS — BP 136/86 | HR 86 | Ht 68.0 in | Wt 190.1 lb

## 2023-05-17 DIAGNOSIS — I10 Essential (primary) hypertension: Secondary | ICD-10-CM

## 2023-05-17 NOTE — Patient Instructions (Signed)
I appreciate the opportunity to provide care to you today!    Follow up:  4 months   Good JOB!  Keep up the good work and continue your current treatment regimen.     Please continue to a heart-healthy diet and increase your physical activities. Try to exercise for at least five days a week.    It was a pleasure to see you and I look forward to continuing to work together on your health and well-being. Please do not hesitate to call the office if you need care or have questions about your care.  In case of emergency, please visit the Emergency Department for urgent care, or contact our clinic at [phone] to schedule an appointment. We're here to help you!   Have a wonderful day and week. With Gratitude, Gilmore Laroche MSN, FNP-BC

## 2023-05-17 NOTE — Progress Notes (Signed)
Please inform the patient that his renal function is stable  Established Patient Office Visit  Subjective:  Patient ID: Antonio Small, male    DOB: 20-Jun-1961  Age: 62 y.o. MRN: [id number]  CC:  Chief Complaint  Patient presents with   Care Management    1 month f/u    HPI Antonio Small is a 62 y.o. male presents for hypertension.  Hypertension: The patient takes amlodipine 10 mg daily, olmesartan 40 mg daily, hydrochlorothiazide 25 mg daily, and hydralazine 10 mg three times daily. He did not attend his follow-up appointment at the hypertension clinic due to transportation issues. The patient reports implementing lifestyle changes and taking beet supplements to help lower his blood pressure. He is asymptomatic in the clinic and reports treatment compliance.   Past Medical History:  Diagnosis Date   Chronic back pain    Hyperlipidemia    Hypertension     Past Surgical History:  Procedure Laterality Date   BACK SURGERY     HERNIA REPAIR Left    INGUINAL HERNIA REPAIR Right 05/13/2020   Procedure: RIGHT INGUINAL HERNIA REPAIR WITH MESH;  Surgeon: [name], MD;  Location: [facility];  Service: General;  Laterality: Right;   INSERTION OF MESH Right 05/13/2020   Procedure: INSERTION OF MESH;  Surgeon: [name], MD;  Location: [facility];  Service: General;  Laterality: Right;    Family History  Problem Relation Age of Onset   Hypertension Mother    Hypertension Father    Hypertension Sister     Social History   Socioeconomic History   Marital status: Legally Separated    Spouse name: Not on file   Number of children: 1   Years of education: Not on file   Highest education level: Not on file  Occupational History   Occupation: Disabled    Comment: d/t back pain  Tobacco Use   Smoking status: Former    Current packs/day: 0.00    Average packs/day: 0.5 packs/day for 40.0 years (20.0 ttl pk-yrs)    Types: Cigarettes     Start date: 08/07/1982    Quit date: 08/07/2022    Years since quitting: 0.7   Smokeless tobacco: Never  Vaping Use   Vaping status: Never Used  Substance and Sexual Activity   Alcohol use: Not Currently    Comment: occassionally   Drug use: No   Sexual activity: Yes  Other Topics Concern   Not on file  Social History Narrative   1 daughter   Social Determinants of Health   Financial Resource Strain: Not on file  Food Insecurity: Not on file  Transportation Needs: Not on file  Physical Activity: Not on file  Stress: Not on file  Social Connections: Not on file  Intimate Partner Violence: Not on file    Outpatient Medications Prior to Visit  Medication Sig Dispense Refill   amLODipine (NORVASC) 10 MG tablet Take 1 tablet (10 mg total) by mouth daily. 60 tablet 3   aspirin EC 81 MG tablet Take 81 mg by mouth every morning.     Blood Pressure Monitoring (COMFORT TOUCH BP CUFF/MEDIUM) MISC 1 each by Does not apply route daily. 1 each 0   hydrALAZINE (APRESOLINE) 10 MG tablet Take 1 tablet (10 mg total) by mouth 3 (three) times daily. 90 tablet 1   hydrochlorothiazide (HYDRODIURIL) 25 MG tablet Take 1 tablet (25 mg total) by mouth daily. 60 tablet 3   olmesartan (BENICAR) 40  MG tablet Take 1 tablet (40 mg total) by mouth daily. 60 tablet 3   Vitamin D, Ergocalciferol, (DRISDOL) 1.25 MG (50000 UNIT) CAPS capsule Take 1 capsule (50,000 Units total) by mouth every 7 (seven) days. 10 capsule 1   No facility-administered medications prior to visit.    No Known Allergies  ROS Review of Systems  Constitutional:  Negative for fatigue and fever.  Eyes:  Negative for visual disturbance.  Respiratory:  Negative for chest tightness and shortness of breath.   Cardiovascular:  Negative for chest pain and palpitations.  Neurological:  Negative for dizziness and headaches.      Objective:    Physical Exam HENT:     Head: Normocephalic.     Right Ear: External ear normal.     Left  Ear: External ear normal.     Nose: No congestion or rhinorrhea.     Mouth/Throat:     Mouth: Mucous membranes are moist.  Cardiovascular:     Rate and Rhythm: Regular rhythm.     Heart sounds: No murmur heard. Pulmonary:     Effort: No respiratory distress.     Breath sounds: Normal breath sounds.  Neurological:     Mental Status: He is alert.     BP 136/86   Pulse 86   Ht 5\' 8"  (1.727 m)   Wt 190 lb 1.9 oz (86.2 kg)   SpO2 97%   BMI 28.91 kg/m  Wt Readings from Last 3 Encounters:  05/17/23 190 lb 1.9 oz (86.2 kg)  04/17/23 192 lb 1.9 oz (87.1 kg)  03/15/23 197 lb (89.4 kg)    Lab Results  Component Value Date   TSH 1.420 09/07/2022   Lab Results  Component Value Date   WBC 5.2 09/28/2022   HGB 14.9 09/28/2022   HCT 44.4 09/28/2022   MCV 94 09/28/2022   PLT 261 09/28/2022   Lab Results  Component Value Date   NA 142 04/17/2023   K 3.6 04/17/2023   CO2 29 04/17/2023   GLUCOSE 105 (H) 04/17/2023   BUN 11 04/17/2023   CREATININE 1.18 04/17/2023   BILITOT 0.6 09/07/2022   ALKPHOS 99 09/07/2022   AST 26 09/07/2022   ALT 35 09/07/2022   PROT 7.6 09/07/2022   ALBUMIN 4.4 09/07/2022   CALCIUM 10.4 (H) 04/17/2023   ANIONGAP 9 05/12/2020   EGFR 70 04/17/2023   Lab Results  Component Value Date   CHOL 146 09/07/2022   Lab Results  Component Value Date   HDL 47 09/07/2022   Lab Results  Component Value Date   LDLCALC 85 09/07/2022   Lab Results  Component Value Date   TRIG 70 09/07/2022   Lab Results  Component Value Date   CHOLHDL 3.1 09/07/2022   Lab Results  Component Value Date   HGBA1C 5.5 09/07/2022      Assessment & Plan:  Hypertension, essential Assessment & Plan: Controlled  Encouraged to continue taking  amlodipine 10 mg daily, olmesartan 40 mg daily, hydrochlorothiazide 25 mg daily, and hydralazine 10 mg three times daily Low-sodium diet with increased physical activity encouraged BP Readings from Last 3 Encounters:  05/17/23  136/86  04/17/23 (!) 142/88  03/15/23 (!) 142/90      Note: This chart has been completed using [employer], and while attempts have been made to ensure accuracy, certain words and phrases may not be transcribed as intended.    Follow-up: Return in about 4 months (around 09/17/2023).  Gilmore Laroche, FNP

## 2023-05-17 NOTE — Assessment & Plan Note (Signed)
Controlled  Encouraged to continue taking  amlodipine 10 mg daily, olmesartan 40 mg daily, hydrochlorothiazide 25 mg daily, and hydralazine 10 mg three times daily Low-sodium diet with increased physical activity encouraged BP Readings from Last 3 Encounters:  05/17/23 136/86  04/17/23 (!) 142/88  03/15/23 (!) 142/90

## 2023-06-04 ENCOUNTER — Telehealth: Payer: Self-pay | Admitting: Family Medicine

## 2023-06-04 ENCOUNTER — Other Ambulatory Visit: Payer: Self-pay

## 2023-06-04 ENCOUNTER — Other Ambulatory Visit: Payer: Self-pay | Admitting: Family Medicine

## 2023-06-04 DIAGNOSIS — I10 Essential (primary) hypertension: Secondary | ICD-10-CM

## 2023-06-04 MED ORDER — HYDRALAZINE HCL 10 MG PO TABS
10.0000 mg | ORAL_TABLET | Freq: Three times a day (TID) | ORAL | 1 refills | Status: DC
Start: 2023-06-04 — End: 2023-08-23

## 2023-06-04 NOTE — Telephone Encounter (Signed)
  Prescription Request REQUEST 90 DAY SUPPLY  06/04/2023  LOV: 05/17/2023  What is the name of the medication or equipment? hydrALAZINE (APRESOLINE) 10 MG tablet [[id number]]   Have you contacted your pharmacy to request a refill? Yes   Which pharmacy would you like this sent to?  [id number] - [place], [place] - [address] [place] [address] Phone: [phone] Fax: [phone]    Patient notified that their request is being sent to the clinical staff for review and that they should receive a response within 2 business days.   Please advise at  walked in

## 2023-06-04 NOTE — Telephone Encounter (Signed)
Rx sent 

## 2023-07-03 ENCOUNTER — Encounter (HOSPITAL_BASED_OUTPATIENT_CLINIC_OR_DEPARTMENT_OTHER): Payer: Self-pay

## 2023-08-23 ENCOUNTER — Other Ambulatory Visit: Payer: Self-pay | Admitting: Family Medicine

## 2023-08-23 DIAGNOSIS — I10 Essential (primary) hypertension: Secondary | ICD-10-CM

## 2023-08-28 ENCOUNTER — Other Ambulatory Visit: Payer: Self-pay | Admitting: Family Medicine

## 2023-08-28 DIAGNOSIS — I10 Essential (primary) hypertension: Secondary | ICD-10-CM

## 2023-09-17 ENCOUNTER — Other Ambulatory Visit: Payer: Self-pay | Admitting: Family Medicine

## 2023-09-17 ENCOUNTER — Encounter: Payer: Self-pay | Admitting: Family Medicine

## 2023-09-17 ENCOUNTER — Ambulatory Visit (INDEPENDENT_AMBULATORY_CARE_PROVIDER_SITE_OTHER): Payer: 59 | Admitting: Family Medicine

## 2023-09-17 VITALS — BP 140/70 | HR 95 | Ht 68.0 in | Wt 189.0 lb

## 2023-09-17 DIAGNOSIS — E559 Vitamin D deficiency, unspecified: Secondary | ICD-10-CM

## 2023-09-17 DIAGNOSIS — F17200 Nicotine dependence, unspecified, uncomplicated: Secondary | ICD-10-CM | POA: Diagnosis not present

## 2023-09-17 DIAGNOSIS — R7301 Impaired fasting glucose: Secondary | ICD-10-CM

## 2023-09-17 DIAGNOSIS — Z720 Tobacco use: Secondary | ICD-10-CM

## 2023-09-17 DIAGNOSIS — I10 Essential (primary) hypertension: Secondary | ICD-10-CM

## 2023-09-17 DIAGNOSIS — E7849 Other hyperlipidemia: Secondary | ICD-10-CM | POA: Diagnosis not present

## 2023-09-17 DIAGNOSIS — E038 Other specified hypothyroidism: Secondary | ICD-10-CM

## 2023-09-17 MED ORDER — PRAVASTATIN SODIUM 40 MG PO TABS: 40.0000 mg | ORAL_TABLET | Freq: Every day | ORAL | 1 refills | Status: DC

## 2023-09-17 MED ORDER — HYDRALAZINE HCL 10 MG PO TABS: 10.0000 mg | ORAL_TABLET | Freq: Three times a day (TID) | ORAL | 1 refills | Status: DC

## 2023-09-17 MED ORDER — OLMESARTAN MEDOXOMIL 40 MG PO TABS: 40.0000 mg | ORAL_TABLET | Freq: Every day | ORAL | 1 refills | Status: DC

## 2023-09-17 MED ORDER — AMLODIPINE BESYLATE 10 MG PO TABS: 10.0000 mg | ORAL_TABLET | Freq: Every day | ORAL | 3 refills | Status: AC

## 2023-09-17 MED ORDER — VITAMIN D (ERGOCALCIFEROL) 1.25 MG (50000 UNIT) PO CAPS: 50000.0000 [IU] | ORAL_CAPSULE | ORAL | 1 refills | Status: DC

## 2023-09-17 MED ORDER — HYDROCHLOROTHIAZIDE 25 MG PO TABS: 25.0000 mg | ORAL_TABLET | Freq: Every day | ORAL | 1 refills | Status: DC

## 2023-09-17 NOTE — Progress Notes (Signed)
 Established Patient Office Visit  Subjective:  Patient ID: Antonio Small, male    DOB: June 16, 1961  Age: 63 y.o. MRN: [id number]  CC:  Chief Complaint  Patient presents with   Follow-up    4 month med refills     HPI Antonio Small is a 63 y.o. male with past medical history of hypertension, hyperlipidemia, vitamin D  deficiency presents for f/u of  chronic medical conditions. For the details of today's visit, please refer to the assessment and plan.     Past Medical History:  Diagnosis Date   Chronic back pain    Hyperlipidemia    Hypertension     Past Surgical History:  Procedure Laterality Date   BACK SURGERY     HERNIA REPAIR Left    INGUINAL HERNIA REPAIR Right 05/13/2020   Procedure: RIGHT INGUINAL HERNIA REPAIR WITH MESH;  Surgeon: Oza Blumenthal, MD;  Location: [facility];  Service: General;  Laterality: Right;   INSERTION OF MESH Right 05/13/2020   Procedure: INSERTION OF MESH;  Surgeon: Oza Blumenthal, MD;  Location: [facility];  Service: General;  Laterality: Right;    Family History  Problem Relation Age of Onset   Hypertension Mother    Hypertension Father    Hypertension Sister     Social History   Socioeconomic History   Marital status: Legally Separated    Spouse name: Not on file   Number of children: 1   Years of education: Not on file   Highest education level: Not on file  Occupational History   Occupation: Disabled    Comment: d/t back pain  Tobacco Use   Smoking status: Former    Current packs/day: 0.00    Average packs/day: 0.5 packs/day for 40.0 years (20.0 ttl pk-yrs)    Types: Cigarettes    Start date: 08/07/1982    Quit date: 08/07/2022    Years since quitting: 1.1   Smokeless tobacco: Never  Vaping Use   Vaping status: Never Used  Substance and Sexual Activity   Alcohol use: Not Currently    Alcohol/week: 2.0 standard drinks of alcohol    Types: 2 Cans of beer per week    Comment:  occassionally   Drug use: No   Sexual activity: Yes  Other Topics Concern   Not on file  Social History Narrative   1 daughter   Social Drivers of Corporate investment banker Strain: Not on file  Food Insecurity: Not on file  Transportation Needs: Not on file  Physical Activity: Not on file  Stress: Not on file  Social Connections: Not on file  Intimate Partner Violence: Not on file    Outpatient Medications Prior to Visit  Medication Sig Dispense Refill   aspirin EC 81 MG tablet Take 81 mg by mouth every morning.     Blood Pressure Monitoring (COMFORT TOUCH BP CUFF/MEDIUM) MISC 1 each by Does not apply route daily. 1 each 0   amLODipine  (NORVASC ) 10 MG tablet TAKE 1 TABLET(10 MG) BY MOUTH DAILY 60 tablet 3   hydrALAZINE  (APRESOLINE ) 10 MG tablet TAKE 1 TABLET(10 MG) BY MOUTH THREE TIMES DAILY 90 tablet 1   hydrochlorothiazide  (HYDRODIURIL ) 25 MG tablet Take 1 tablet (25 mg total) by mouth daily. 60 tablet 3   olmesartan  (BENICAR ) 40 MG tablet Take 1 tablet (40 mg total) by mouth daily. 60 tablet 3   pravastatin  (PRAVACHOL ) 40 MG tablet Take 40 mg by mouth daily.  Vitamin D , Ergocalciferol , (DRISDOL ) 1.25 MG (50000 UNIT) CAPS capsule Take 1 capsule (50,000 Units total) by mouth every 7 (seven) days. 10 capsule 1   No facility-administered medications prior to visit.    No Known Allergies  ROS Review of Systems  Constitutional:  Negative for fatigue and fever.  Eyes:  Negative for visual disturbance.  Respiratory:  Negative for chest tightness and shortness of breath.   Cardiovascular:  Negative for chest pain and palpitations.  Neurological:  Negative for dizziness and headaches.      Objective:    Physical Exam HENT:     Head: Normocephalic.     Right Ear: External ear normal.     Left Ear: External ear normal.     Nose: No congestion or rhinorrhea.     Mouth/Throat:     Mouth: Mucous membranes are moist.  Cardiovascular:     Rate and Rhythm: Regular  rhythm.     Heart sounds: No murmur heard. Pulmonary:     Effort: No respiratory distress.     Breath sounds: Normal breath sounds.  Neurological:     Mental Status: He is alert.     BP (!) 140/70   Pulse 95   Ht 5\' 8"  (1.727 m)   Wt 189 lb 0.6 oz (85.7 kg)   SpO2 96%   BMI 28.74 kg/m  Wt Readings from Last 3 Encounters:  09/17/23 189 lb 0.6 oz (85.7 kg)  05/17/23 190 lb 1.9 oz (86.2 kg)  04/17/23 192 lb 1.9 oz (87.1 kg)    Lab Results  Component Value Date   TSH 1.420 09/07/2022   Lab Results  Component Value Date   WBC 5.2 09/28/2022   HGB 14.9 09/28/2022   HCT 44.4 09/28/2022   MCV 94 09/28/2022   PLT 261 09/28/2022   Lab Results  Component Value Date   NA 142 04/17/2023   K 3.6 04/17/2023   CO2 29 04/17/2023   GLUCOSE 105 (H) 04/17/2023   BUN 11 04/17/2023   CREATININE 1.18 04/17/2023   BILITOT 0.6 09/07/2022   ALKPHOS 99 09/07/2022   AST 26 09/07/2022   ALT 35 09/07/2022   PROT 7.6 09/07/2022   ALBUMIN 4.4 09/07/2022   CALCIUM 10.4 (H) 04/17/2023   ANIONGAP 9 05/12/2020   EGFR 70 04/17/2023   Lab Results  Component Value Date   CHOL 146 09/07/2022   Lab Results  Component Value Date   HDL 47 09/07/2022   Lab Results  Component Value Date   LDLCALC 85 09/07/2022   Lab Results  Component Value Date   TRIG 70 09/07/2022   Lab Results  Component Value Date   CHOLHDL 3.1 09/07/2022   Lab Results  Component Value Date   HGBA1C 5.5 09/07/2022      Assessment & Plan:  Hypertension, essential Assessment & Plan: Uncontrolled  The patient reports compliance with amlodipine  10 mg daily, olmesartan  40 mg daily, hydrochlorothiazide  25 mg daily, and hydralazine  10 mg three times daily. He is asymptomatic in the clinic today and reports that his ambulatory blood pressure readings are consistently below 140/90. A low-sodium diet of less than 2,300 mg daily is recommended, along with moderate-intensity physical activity, aiming for 150 minutes  per week. The patient is encouraged to continue these lifestyle modifications to help manage his blood pressure effectively.  BP Readings from Last 3 Encounters:  09/17/23 (!) 140/70  05/17/23 136/86  04/17/23 (!) 142/88     Orders: -  hydrALAZINE  HCl; Take 1 tablet (10 mg total) by mouth 3 (three) times daily.  Dispense: 120 tablet; Refill: 1 -     Olmesartan  Medoxomil; Take 1 tablet (40 mg total) by mouth daily.  Dispense: 90 tablet; Refill: 1 -     hydroCHLOROthiazide ; Take 1 tablet (25 mg total) by mouth daily.  Dispense: 90 tablet; Refill: 1 -     amLODIPine  Besylate; Take 1 tablet (10 mg total) by mouth daily.  Dispense: 90 tablet; Refill: 3  TOBACCO ABUSE Assessment & Plan: The patient reports quitting smoking approximately 8 to 9 months ago; however, he stated that he began smoking in 1981, averaging two packs per day. He was congratulated on his smoking cessation, and a referral has been placed to pulmonology for lung cancer screening. The patient is currently asymptomatic in the clinic.     Other hyperlipidemia Assessment & Plan: The patient reports compliance with pravastatin  40 mg daily. The patient was encouraged to make lifestyle changes, including avoiding simple carbohydrates such as cakes, sweet desserts, ice cream, soda (diet or regular), sweet tea, candies, chips, cookies, store-bought juices, excessive alcohol (more than 1-2 drinks per day), lemonade, artificial sweeteners, donuts, coffee creamers, and sugar-free products. Additionally, reducing the consumption of greasy, fatty foods and increasing physical activity were recommended. The patient verbalized understanding and is aware of the plan of care.   Orders: -     Pravastatin  Sodium; Take 1 tablet (40 mg total) by mouth daily.  Dispense: 90 tablet; Refill: 1 -     Lipid panel -     CMP14+EGFR -     CBC with Differential/Platelet  Tobacco use Assessment & Plan: The patient reports quitting smoking  approximately 8 to 9 months ago; however, he stated that he began smoking in 1981, averaging two packs per day. He was congratulated on his smoking cessation, and a referral has been placed to pulmonology for lung cancer screening. The patient is currently asymptomatic in the clinic.    Orders: -     Ambulatory Referral for Lung Cancer Scre  Vitamin D  deficiency Assessment & Plan: Encouraged to increase his intake of vitamin D -rich foods such as fatty fish (e.g., salmon, mackerel, and sardines), fortified dairy products, egg yolks, and fortified cereals.   Orders: -     Vitamin D  (Ergocalciferol ); Take 1 capsule (50,000 Units total) by mouth every 7 (seven) days.  Dispense: 20 capsule; Refill: 1 -     VITAMIN D  25 Hydroxy (Vit-D Deficiency, Fractures)  IFG (impaired fasting glucose) -     Hemoglobin A1c  TSH (thyroid -stimulating hormone deficiency) -     TSH + free T4  Note: This chart has been completed using [employer]cons[employer]) software, and while attempts have been made to ensure accuracy, certain words and phrases may not be transcribed as intended.    Follow-up: Return in about 4 months (around 01/15/2024).   Basir Niven, FNP

## 2023-09-17 NOTE — Patient Instructions (Addendum)
 I appreciate the opportunity to provide care to you today!    Follow up:  4 months  Labs: please stop by the lab today to get your blood drawn (CBC, CMP, TSH, Lipid profile, HgA1c, Vit D)  Schedule for medicare annual wellness visit   Referrals today-  Lung cancer screening  Attached with your AVS, you will find valuable resources for self-education. I highly recommend dedicating some time to thoroughly examine them.   Please continue to a heart-healthy diet and increase your physical activities. Try to exercise for at least five days a week.    It was a pleasure to see you and I look forward to continuing to work together on your health and well-being. Please do not hesitate to call the office if you need care or have questions about your care.  In case of emergency, please visit the Emergency Department for urgent care, or contact our clinic at [phone] to schedule an appointment. We're here to help you!   Have a wonderful day and week. With Gratitude, Eder Macek MSN, FNP-BC

## 2023-09-17 NOTE — Assessment & Plan Note (Signed)
 Uncontrolled  The patient reports compliance with amlodipine  10 mg daily, olmesartan  40 mg daily, hydrochlorothiazide  25 mg daily, and hydralazine  10 mg three times daily. He is asymptomatic in the clinic today and reports that his ambulatory blood pressure readings are consistently below 140/90. A low-sodium diet of less than 2,300 mg daily is recommended, along with moderate-intensity physical activity, aiming for 150 minutes per week. The patient is encouraged to continue these lifestyle modifications to help manage his blood pressure effectively.  BP Readings from Last 3 Encounters:  09/17/23 (!) 140/70  05/17/23 136/86  04/17/23 (!) 142/88

## 2023-09-17 NOTE — Assessment & Plan Note (Signed)
 The patient reports compliance with pravastatin  40 mg daily. The patient was encouraged to make lifestyle changes, including avoiding simple carbohydrates such as cakes, sweet desserts, ice cream, soda (diet or regular), sweet tea, candies, chips, cookies, store-bought juices, excessive alcohol (more than 1-2 drinks per day), lemonade, artificial sweeteners, donuts, coffee creamers, and sugar-free products. Additionally, reducing the consumption of greasy, fatty foods and increasing physical activity were recommended. The patient verbalized understanding and is aware of the plan of care.

## 2023-09-17 NOTE — Assessment & Plan Note (Signed)
 The patient reports quitting smoking approximately 8 to 9 months ago; however, he stated that he began smoking in 1981, averaging two packs per day. He was congratulated on his smoking cessation, and a referral has been placed to pulmonology for lung cancer screening. The patient is currently asymptomatic in the clinic.

## 2023-09-17 NOTE — Assessment & Plan Note (Signed)
 Encouraged to increase his intake of vitamin D-rich foods such as fatty fish (e.g., salmon, mackerel, and sardines), fortified dairy products, egg yolks, and fortified cereals.

## 2023-09-18 LAB — LIPID PANEL
Chol/HDL Ratio: 4.2 {ratio} (ref 0.0–5.0)
Cholesterol, Total: 206 mg/dL — ABNORMAL HIGH (ref 100–199)
HDL: 49 mg/dL (ref >39)
LDL Chol Calc (NIH): 138 mg/dL — ABNORMAL HIGH (ref 0–99)
Triglycerides: 106 mg/dL (ref 0–149)
VLDL Cholesterol Cal: 19 mg/dL (ref 5–40)

## 2023-09-18 LAB — CMP14+EGFR
ALT: 26 [IU]/L (ref 0–44)
AST: 23 [IU]/L (ref 0–40)
Albumin: 4.3 g/dL (ref 3.9–4.9)
Alkaline Phosphatase: 94 [IU]/L (ref 44–121)
BUN/Creatinine Ratio: 16 (ref 10–24)
BUN: 19 mg/dL (ref 8–27)
Bilirubin Total: 0.5 mg/dL (ref 0.0–1.2)
CO2: 25 mmol/L (ref 20–29)
Calcium: 10 mg/dL (ref 8.6–10.2)
Chloride: 96 mmol/L (ref 96–106)
Creatinine, Ser: 1.19 mg/dL (ref 0.76–1.27)
Globulin, Total: 3.7 g/dL (ref 1.5–4.5)
Glucose: 105 mg/dL — ABNORMAL HIGH (ref 70–99)
Potassium: 3.8 mmol/L (ref 3.5–5.2)
Sodium: 138 mmol/L (ref 134–144)
Total Protein: 8 g/dL (ref 6.0–8.5)
eGFR: 69 mL/min/{1.73_m2} (ref >59)

## 2023-09-18 LAB — CBC WITH DIFFERENTIAL/PLATELET
Basophils Absolute: 0 10*3/uL (ref 0.0–0.2)
Basos: 1 %
EOS (ABSOLUTE): 0.2 10*3/uL (ref 0.0–0.4)
Eos: 4 %
Hematocrit: 47.3 % (ref 37.5–51.0)
Hemoglobin: 15.6 g/dL (ref 13.0–17.7)
Immature Grans (Abs): 0 10*3/uL (ref 0.0–0.1)
Immature Granulocytes: 0 %
Lymphocytes Absolute: 1.7 10*3/uL (ref 0.7–3.1)
Lymphs: 32 %
MCH: 31 pg (ref 26.6–33.0)
MCHC: 33 g/dL (ref 31.5–35.7)
MCV: 94 fL (ref 79–97)
Monocytes Absolute: 0.7 10*3/uL (ref 0.1–0.9)
Monocytes: 13 %
Neutrophils Absolute: 2.7 10*3/uL (ref 1.4–7.0)
Neutrophils: 50 %
Platelets: 313 10*3/uL (ref 150–450)
RBC: 5.03 x10E6/uL (ref 4.14–5.80)
RDW: 11.8 % (ref 11.6–15.4)
WBC: 5.3 10*3/uL (ref 3.4–10.8)

## 2023-09-18 LAB — HEMOGLOBIN A1C
Est. average glucose Bld gHb Est-mCnc: 114 mg/dL
Hgb A1c MFr Bld: 5.6 % (ref 4.8–5.6)

## 2023-09-18 LAB — VITAMIN D 25 HYDROXY (VIT D DEFICIENCY, FRACTURES): Vit D, 25-Hydroxy: 23.1 ng/mL — ABNORMAL LOW (ref 30.0–100.0)

## 2023-09-18 LAB — TSH+FREE T4
Free T4: 1.21 ng/dL (ref 0.82–1.77)
TSH: 1.07 u[IU]/mL (ref 0.450–4.500)

## 2023-10-01 NOTE — Progress Notes (Signed)
 Please encourage the patient to continue taking his weekly vitamin D supplement and Pravastatin 40 mg daily as prescribed.  I also recommend implementing lifestyle changes, including reducing his intake of greasy, fatty, and starchy foods while increasing physical activity to help manage his health.  All other lab results are stable.

## 2023-11-05 ENCOUNTER — Telehealth: Payer: Self-pay | Admitting: Acute Care

## 2023-11-05 ENCOUNTER — Encounter: Payer: Self-pay | Admitting: Emergency Medicine

## 2023-11-05 DIAGNOSIS — Z87891 Personal history of nicotine dependence: Secondary | ICD-10-CM

## 2023-11-05 DIAGNOSIS — Z122 Encounter for screening for malignant neoplasm of respiratory organs: Secondary | ICD-10-CM

## 2023-11-05 NOTE — Telephone Encounter (Signed)
 Lung Cancer Screening Narrative/Criteria Questionnaire (Cigarette Smokers Only- No Cigars/Pipes/vapes)   Antonio Small   SDMV:12/03/2023 at 11:00am with Orpha Bur         1961/01/06   LDCT: 12/04/2023 at 10:30am    63 y.o.   Phone: [phone]  Lung Screening Narrative (confirm age 64-77 yrs Medicare / 50-80 yrs Private pay insurance)   Insurance information:[employer]   Referring Provider:Gloria Zarwolo NP   This screening involves an initial phone call with a team member from our program. It is called a shared decision making visit. The initial meeting is required by  insurance and Medicare to make sure you understand the program. This appointment takes about 15-20 minutes to complete. You will complete the screening scan at your scheduled date/time.  This scan takes about 5-10 minutes to complete. You can eat and drink normally before and after the scan.  Criteria questions for Lung Cancer Screening:   Are you a current or former smoker? Former Age began smoking: 63yo   If you are a former smoker, what year did you quit smoking? 2023 (within 15 yrs)   To calculate your smoking history, I need an accurate estimate of how many packs of cigarettes you smoked per day and for how many years. (Not just the number of PPD you are now smoking)   Years smoking 42 x Packs per day 1 = Pack years 42   (at least 20 pack yrs)   (Make sure they understand that we need to know how much they have smoked in the past, not just the number of PPD they are smoking now)  Do you have a personal history of cancer?  No    Do you have a family history of cancer? No  Are you coughing up blood?  No  Have you had unexplained weight loss of 15 lbs or more in the last 6 months? No  It looks like you meet all criteria.  When would be a good time for [place] to schedule you for this screening?   Additional information: N/A

## 2023-12-03 ENCOUNTER — Ambulatory Visit (INDEPENDENT_AMBULATORY_CARE_PROVIDER_SITE_OTHER): Admitting: Adult Health

## 2023-12-03 ENCOUNTER — Encounter: Payer: Self-pay | Admitting: Adult Health

## 2023-12-03 DIAGNOSIS — Z87891 Personal history of nicotine dependence: Secondary | ICD-10-CM | POA: Diagnosis not present

## 2023-12-03 NOTE — Progress Notes (Signed)
  Virtual Visit via Telephone Note  I connected with Antonio Small , 12/03/23 11:01 AM by a telemedicine application and verified that I am speaking with the correct person using two identifiers.  Location: Patient: home Provider: home   I discussed the limitations of evaluation and management by telemedicine and the availability of in person appointments. The patient expressed understanding and agreed to proceed.   Shared Decision Making Visit Lung Cancer Screening Program [id number])   Eligibility: 63 y.o. Pack Years Smoking History Calculation = 42 pack years  (# packs/per year x # years smoked) Recent History of coughing up blood  no Unexplained weight loss? no ( >Than 15 pounds within the last 6 months ) Prior History Lung / other cancer no (Diagnosis within the last 5 years already requiring surveillance chest CT Scans). Smoking Status Former Smoker Former Smokers: Years since quit: 2 years  Quit Date: 2023  Visit Components: Discussion included one or more decision making aids. YES Discussion included risk/benefits of screening. YES Discussion included potential follow up diagnostic testing for abnormal scans. YES Discussion included meaning and risk of over diagnosis. YES Discussion included meaning and risk of False Positives. YES Discussion included meaning of total radiation exposure. YES  Counseling Included: Importance of adherence to annual lung cancer LDCT screening. YES Impact of comorbidities on ability to participate in the program. YES Ability and willingness to under diagnostic treatment. YES  Smoking Cessation Counseling: Former Smokers:  Discussed the importance of maintaining cigarette abstinence. yes Diagnosis Code: Personal History of Nicotine Dependence. W29.562 Information about tobacco cessation classes and interventions provided to patient. Yes Patient provided with "ticket" for LDCT Scan. yes Written Order for Lung Cancer Screening with LDCT  placed in Epic. Yes (CT Chest Lung Cancer Screening Low Dose W/O CM) [id number]  Z12.2-Screening of respiratory organs Z87.891-Personal history of nicotine dependence   [place] Dose 12/03/23

## 2023-12-03 NOTE — Patient Instructions (Signed)

## 2023-12-04 ENCOUNTER — Ambulatory Visit (HOSPITAL_COMMUNITY)
Admission: RE | Admit: 2023-12-04 | Discharge: 2023-12-04 | Disposition: A | Discharge status: Home / Self Care | Source: Ambulatory Visit | Attending: Acute Care | Admitting: Acute Care

## 2023-12-04 DIAGNOSIS — Z122 Encounter for screening for malignant neoplasm of respiratory organs: Secondary | ICD-10-CM | POA: Insufficient documentation

## 2023-12-04 DIAGNOSIS — Z87891 Personal history of nicotine dependence: Secondary | ICD-10-CM | POA: Insufficient documentation

## 2023-12-04 DIAGNOSIS — F17210 Nicotine dependence, cigarettes, uncomplicated: Secondary | ICD-10-CM | POA: Diagnosis not present

## 2023-12-20 ENCOUNTER — Other Ambulatory Visit: Payer: Self-pay | Admitting: Family Medicine

## 2023-12-20 DIAGNOSIS — E7849 Other hyperlipidemia: Secondary | ICD-10-CM

## 2023-12-27 ENCOUNTER — Telehealth: Payer: Self-pay | Admitting: Acute Care

## 2023-12-27 NOTE — Telephone Encounter (Signed)
 Received call report:  IMPRESSION: Lung-RADS 2, benign appearance or behavior. Continue annual screening with low-dose chest CT without contrast in 12 months.   Aortic Atherosclerosis (ICD10-I70.0) and Emphysema (ICD10-J43.9).    ADDENDUM: As mentioned in the body of the report, there is a 3.7 cm lesion in the left thyroid  lobe. Recommend thyroid  US  (ref: J Am Coll Radiol. 2015 Feb;12(2): 143-50).

## 2024-01-03 ENCOUNTER — Encounter: Payer: Self-pay | Admitting: *Deleted

## 2024-01-03 ENCOUNTER — Ambulatory Visit: Payer: Self-pay

## 2024-01-03 ENCOUNTER — Telehealth: Payer: Self-pay | Admitting: Acute Care

## 2024-01-03 NOTE — Telephone Encounter (Signed)
 Copied from CRM [id number]. Topic: General - Other >> Jan 03, 2024 10:26 AM Antonio Small wrote: Antonio Small calling Antonio Small had a CT done with [place] [facility] for screening Thyroid  Nodule was found Antonio Small has not be able to be contacted   Chief Complaint: Results  Additional Notes: Antonio Galley RN with [facility] at lung cancer screening advised that CT done on 12/04/23, has resulted 12/27/23, picked up a up thyroid  nodule. Unable to reach. Next appt with PCP is 01/17/24. Thyroid  U/S is now needed.   Reason for Disposition  Lab or radiology calling with test results  Answer Assessment - Initial Assessment Questions 1. REASON FOR CALL or QUESTION: "What is your reason for calling today?" or "How can I best help you?" or "What question do you have that I can help answer?"     Antonio Small has abnormal CT results  2. CALLER: Document the source of call. (e.g., laboratory, patient).     Antonio Galley, RN with [facility]  Protocols used: PCP Call - No Triage-A-AH

## 2024-01-03 NOTE — Telephone Encounter (Signed)
 Letter mailed to pt asking him to contact our office to discuss CT results. Spoke with [facility] office and advised of attempt to contact pt regarding thyroid  nodule. Pt as upcoming appt with PCP on 01/17/2024.

## 2024-01-03 NOTE — Telephone Encounter (Signed)
 See provider note 01/03/2024

## 2024-01-03 NOTE — Telephone Encounter (Signed)
 I have attempted to call the patient with the results of their  Low Dose CT Chest Lung cancer screening scan. There was no answer. I have left a HIPPA compliant VM requesting the patient call the office for the scan results. I included the office contact information in the message. We will await his return call. If no return call we will continue to call until patient is contacted.    Pt. Screening scan was read as a LR 2. However, there is an addendum by radiology that there is a 3.7 cm lesion in the left thyroid . We are unable to get in touch with the patient by phone , and there is not an option to leave a VM as the VM is full.   We are calling his PCP office as he has a scheduled appointment with  Gloria Zarwolo FNP  on 01/17/2024, and we will ask her to address this with the patient , and do follow up US  if she feels this is clinically indicated.   We will also send the patient a letter as we cannot get in touch with patient  by phone.

## 2024-01-10 NOTE — Telephone Encounter (Signed)
 Attempted to contact pt again by phone to discuss CT results/ VM full and unable to leave a message at this time.

## 2024-01-14 NOTE — Telephone Encounter (Signed)
 Called patient. Unable to leave VM, VM box is full. Will await follow up appt with PCP on 6/12 to help relay the findings of the LDCT.

## 2024-01-15 NOTE — Telephone Encounter (Signed)
 Attempted patient again. VM Full. Sister's phone rings and drops to busy signal.

## 2024-01-17 ENCOUNTER — Ambulatory Visit (INDEPENDENT_AMBULATORY_CARE_PROVIDER_SITE_OTHER): Payer: 59 | Admitting: Family Medicine

## 2024-01-17 ENCOUNTER — Encounter: Payer: Self-pay | Admitting: Family Medicine

## 2024-01-17 ENCOUNTER — Telehealth: Payer: Self-pay

## 2024-01-17 VITALS — BP 150/93 | HR 73 | Resp 16 | Ht 68.0 in | Wt 189.0 lb

## 2024-01-17 DIAGNOSIS — E7849 Other hyperlipidemia: Secondary | ICD-10-CM | POA: Diagnosis not present

## 2024-01-17 DIAGNOSIS — E038 Other specified hypothyroidism: Secondary | ICD-10-CM | POA: Diagnosis not present

## 2024-01-17 DIAGNOSIS — E559 Vitamin D deficiency, unspecified: Secondary | ICD-10-CM

## 2024-01-17 DIAGNOSIS — R0602 Shortness of breath: Secondary | ICD-10-CM

## 2024-01-17 DIAGNOSIS — R7301 Impaired fasting glucose: Secondary | ICD-10-CM | POA: Diagnosis not present

## 2024-01-17 DIAGNOSIS — I10 Essential (primary) hypertension: Secondary | ICD-10-CM | POA: Diagnosis not present

## 2024-01-17 MED ORDER — OLMESARTAN MEDOXOMIL 40 MG PO TABS: 40.0000 mg | ORAL_TABLET | Freq: Every day | ORAL | 1 refills | Status: DC

## 2024-01-17 MED ORDER — ALBUTEROL SULFATE HFA 108 (90 BASE) MCG/ACT IN AERS: 2.0000 | INHALATION_SPRAY | Freq: Four times a day (QID) | RESPIRATORY_TRACT | 2 refills | Status: DC | PRN

## 2024-01-17 NOTE — Telephone Encounter (Signed)
 Copied from CRM [id number]. Topic: Clinical - Lab/Test Results >> Jan 17, 2024 11:44 AM Antonio Small wrote: Reason for CRM: elise calling about thyroid  nodule found on his scan, the patient needs a ultrasound and they are not able to get in contact with patient would like to speak with Art Bigness or nurse [phone] is the contact number.

## 2024-01-17 NOTE — Assessment & Plan Note (Signed)
 Uncontrolled  The patient reports compliance with amlodipine  10 mg daily, olmesartan  40 mg daily, hydrochlorothiazide  25 mg daily, and hydralazine  10 mg three times daily. He is asymptomatic in the clinic today and reports that his ambulatory blood pressure readings are consistently below 140/90. A low-sodium diet of less than 2,300 mg daily is recommended, along with moderate-intensity physical activity, aiming for 150 minutes per week. The patient is encouraged to continue these lifestyle modifications to help manage his blood pressure effectively.  BP Readings from Last 3 Encounters:  01/17/24 (!) 150/93  12/06/23 (!) 201/110  09/17/23 (!) 140/70

## 2024-01-17 NOTE — Progress Notes (Signed)
 Established Patient Office Visit  Subjective:  Patient ID: Antonio Small, male    DOB: [birth year]  Age: 63 y.o. MRN: [id number]  CC:  Chief Complaint  Patient presents with   Shortness of Breath    States he gets sob when he is trying to exercise     HPI Antonio Small is a 63 y.o. male with past medical history of hypertension, hyperlipidemia, vitamin D  deficiency presents for f/u of  chronic medical conditions.  For the details of today's visit, please refer to the assessment and plan.     Past Medical History:  Diagnosis Date   Chronic back pain    Hyperlipidemia    Hypertension     Past Surgical History:  Procedure Laterality Date   BACK SURGERY     HERNIA REPAIR Left    INGUINAL HERNIA REPAIR Right 05/13/2020   Procedure: RIGHT INGUINAL HERNIA REPAIR WITH MESH;  Surgeon: Oza Blumenthal, MD;  Location: [facility];  Service: General;  Laterality: Right;   INSERTION OF MESH Right 05/13/2020   Procedure: INSERTION OF MESH;  Surgeon: Oza Blumenthal, MD;  Location: [facility];  Service: General;  Laterality: Right;    Family History  Problem Relation Age of Onset   Hypertension Mother    Hypertension Father    Hypertension Sister     Social History   Socioeconomic History   Marital status: Legally Separated    Spouse name: Not on file   Number of children: 1   Years of education: Not on file   Highest education level: Not on file  Occupational History   Occupation: Disabled    Comment: d/t back pain  Tobacco Use   Smoking status: Former    Current packs/day: 0.00    Average packs/day: 0.5 packs/day for 40.0 years (20.0 ttl pk-yrs)    Types: Cigarettes    Start date: 08/07/1982    Quit date: 08/07/2022    Years since quitting: 1.4   Smokeless tobacco: Never  Vaping Use   Vaping status: Never Used  Substance and Sexual Activity   Alcohol use: Not Currently    Alcohol/week: 2.0 standard drinks of alcohol    Types: 2  Cans of beer per week    Comment: occassionally   Drug use: No   Sexual activity: Yes  Other Topics Concern   Not on file  Social History Narrative   1 daughter   Social Drivers of Corporate investment banker Strain: Not on file  Food Insecurity: Not on file  Transportation Needs: Not on file  Physical Activity: Not on file  Stress: Not on file  Social Connections: Not on file  Intimate Partner Violence: Not on file    Outpatient Medications Prior to Visit  Medication Sig Dispense Refill   amLODipine  (NORVASC ) 10 MG tablet Take 1 tablet (10 mg total) by mouth daily. 90 tablet 3   aspirin EC 81 MG tablet Take 81 mg by mouth every morning.     hydrALAZINE  (APRESOLINE ) 10 MG tablet Take 1 tablet (10 mg total) by mouth 3 (three) times daily. 120 tablet 1   hydrochlorothiazide  (HYDRODIURIL ) 25 MG tablet Take 1 tablet (25 mg total) by mouth daily. 90 tablet 1   olmesartan  (BENICAR ) 40 MG tablet Take 1 tablet (40 mg total) by mouth daily. 90 tablet 1   Blood Pressure Monitoring (COMFORT TOUCH BP CUFF/MEDIUM) MISC 1 each by Does not apply route daily. (Patient not taking: Reported on  01/17/2024) 1 each 0   pravastatin  (PRAVACHOL ) 40 MG tablet TAKE 1 TABLET(40 MG) BY MOUTH EVERY MORNING (Patient not taking: Reported on 01/17/2024) 90 tablet 1   Vitamin D , Ergocalciferol , (DRISDOL ) 1.25 MG (50000 UNIT) CAPS capsule Take 1 capsule (50,000 Units total) by mouth every 7 (seven) days. (Patient not taking: Reported on 01/17/2024) 20 capsule 1   No facility-administered medications prior to visit.    No Known Allergies  ROS Review of Systems  Constitutional:  Negative for fatigue and fever.  Eyes:  Negative for visual disturbance.  Respiratory:  Negative for chest tightness and shortness of breath.   Cardiovascular:  Negative for chest pain and palpitations.  Neurological:  Negative for dizziness and headaches.      Objective:    Physical Exam HENT:     Head: Normocephalic.     Right  Ear: External ear normal.     Left Ear: External ear normal.     Nose: No congestion or rhinorrhea.     Mouth/Throat:     Mouth: Mucous membranes are moist.   Cardiovascular:     Rate and Rhythm: Regular rhythm.     Heart sounds: No murmur heard. Pulmonary:     Effort: No respiratory distress.     Breath sounds: Normal breath sounds.   Neurological:     Mental Status: He is alert.     BP (!) 150/93   Pulse 73   Resp 16   Ht 5' 8 (1.727 m)   Wt 189 lb (85.7 kg)   SpO2 93%   BMI 28.74 kg/m  Wt Readings from Last 3 Encounters:  01/17/24 189 lb (85.7 kg)  09/17/23 189 lb 0.6 oz (85.7 kg)  05/17/23 190 lb 1.9 oz (86.2 kg)    Lab Results  Component Value Date   TSH 1.070 09/17/2023   Lab Results  Component Value Date   WBC 5.3 09/17/2023   HGB 15.6 09/17/2023   HCT 47.3 09/17/2023   MCV 94 09/17/2023   PLT 313 09/17/2023   Lab Results  Component Value Date   NA 138 09/17/2023   K 3.8 09/17/2023   CO2 25 09/17/2023   GLUCOSE 105 (H) 09/17/2023   BUN 19 09/17/2023   CREATININE 1.19 09/17/2023   BILITOT 0.5 09/17/2023   ALKPHOS 94 09/17/2023   AST 23 09/17/2023   ALT 26 09/17/2023   PROT 8.0 09/17/2023   ALBUMIN 4.3 09/17/2023   CALCIUM 10.0 09/17/2023   ANIONGAP 9 05/12/2020   EGFR 69 09/17/2023   Lab Results  Component Value Date   CHOL 206 (H) 09/17/2023   Lab Results  Component Value Date   HDL 49 09/17/2023   Lab Results  Component Value Date   LDLCALC 138 (H) 09/17/2023   Lab Results  Component Value Date   TRIG 106 09/17/2023   Lab Results  Component Value Date   CHOLHDL 4.2 09/17/2023   Lab Results  Component Value Date   HGBA1C 5.6 09/17/2023      Assessment & Plan:  Exercise-induced shortness of breath Assessment & Plan: Encouraged to start using Albuterol  inhaler--2 puffs approximately 15 minutes before exercise to help prevent symptoms related to exercise-induced bronchospasm.  Recommend warm-up exercises and to monitor  for shortness of breath, chest tightness, or wheezing during physical activity.  Orders: -     Albuterol  Sulfate HFA; Inhale 2 puffs into the lungs every 6 (six) hours as needed for wheezing or shortness of breath.  Dispense: 8 g;  Refill: 2  Hypertension, essential Assessment & Plan: Uncontrolled  The patient reports compliance with amlodipine  10 mg daily, olmesartan  40 mg daily, hydrochlorothiazide  25 mg daily, and hydralazine  10 mg three times daily. He is asymptomatic in the clinic today and reports that his ambulatory blood pressure readings are consistently below 140/90. A low-sodium diet of less than 2,300 mg daily is recommended, along with moderate-intensity physical activity, aiming for 150 minutes per week. The patient is encouraged to continue these lifestyle modifications to help manage his blood pressure effectively.  BP Readings from Last 3 Encounters:  01/17/24 (!) 150/93  12/06/23 (!) 201/110  09/17/23 (!) 140/70     Orders: -     Olmesartan  Medoxomil; Take 1 tablet (40 mg total) by mouth daily.  Dispense: 90 tablet; Refill: 1  Other hyperlipidemia Assessment & Plan: The patient reports compliance with pravastatin  40 mg daily. The patient was encouraged to make lifestyle changes, including avoiding simple carbohydrates such as cakes, sweet desserts, ice cream, soda (diet or regular), sweet tea, candies, chips, cookies, store-bought juices, excessive alcohol (more than 1-2 drinks per day), lemonade, artificial sweeteners, donuts, coffee creamers, and sugar-free products. Additionally, reducing the consumption of greasy, fatty foods and increasing physical activity were recommended. The patient verbalized understanding and is aware of the plan of care.   Orders: -     Lipid panel -     CMP14+EGFR -     CBC with Differential/Platelet  IFG (impaired fasting glucose) -     Hemoglobin A1c  Vitamin D  deficiency -     VITAMIN D  25 Hydroxy (Vit-D Deficiency,  Fractures)  TSH (thyroid -stimulating hormone deficiency) -     TSH + free T4  Note: This chart has been completed using Engineer, civil (consulting) software, and while attempts have been made to ensure accuracy, certain words and phrases may not be transcribed as intended.    Follow-up: Return in about 4 months (around 05/18/2024).   Kaavya Puskarich, FNP

## 2024-01-17 NOTE — Telephone Encounter (Signed)
 [phone] is the best contact number

## 2024-01-17 NOTE — Patient Instructions (Addendum)
 I appreciate the opportunity to provide care to you today!    Follow up:  4 months  Labs: please stop by the lab today to get your blood drawn (CBC, CMP, TSH, Lipid profile, HgA1c, Vit D)  Exercise-Induced Bronchospasm: Start using Albuterol  inhaler--2 puffs approximately 15 minutes before exercise to help prevent symptoms related to exercise-induced bronchospasm.  Recommend warm-up exercises and to monitor for shortness of breath, chest tightness, or wheezing during physical activity.     Please continue to a heart-healthy diet and increase your physical activities. Try to exercise for at least five days a week.    It was a pleasure to see you and I look forward to continuing to work together on your health and well-being. Please do not hesitate to call the office if you need care or have questions about your care.  In case of emergency, please visit the Emergency Department for urgent care, or contact our clinic at [phone] to schedule an appointment. We're here to help you!   Have a wonderful day and week. With Gratitude, Sonal Dorwart MSN, FNP-BC

## 2024-01-17 NOTE — Assessment & Plan Note (Addendum)
 Encouraged to start using Albuterol  inhaler--2 puffs approximately 15 minutes before exercise to help prevent symptoms related to exercise-induced bronchospasm.  Recommend warm-up exercises and to monitor for shortness of breath, chest tightness, or wheezing during physical activity.

## 2024-01-17 NOTE — Assessment & Plan Note (Signed)
 The patient reports compliance with pravastatin  40 mg daily. The patient was encouraged to make lifestyle changes, including avoiding simple carbohydrates such as cakes, sweet desserts, ice cream, soda (diet or regular), sweet tea, candies, chips, cookies, store-bought juices, excessive alcohol (more than 1-2 drinks per day), lemonade, artificial sweeteners, donuts, coffee creamers, and sugar-free products. Additionally, reducing the consumption of greasy, fatty foods and increasing physical activity were recommended. The patient verbalized understanding and is aware of the plan of care.

## 2024-01-17 NOTE — Telephone Encounter (Signed)
 I looked up this number and they are calling from the lung cancer screening program and saying this pt needs a thyroid  US  per his last low dose chest scan but cannot reach him. Pls advise

## 2024-01-18 LAB — CBC WITH DIFFERENTIAL/PLATELET

## 2024-01-18 NOTE — Telephone Encounter (Signed)
 Spoke with patient and reviewed recent CT results. He advises that the results were reviewed with his PCP yesterday. He will complete annual Lung CT again next year. He is aware that an US  was recommended to evaluate the thyroid  nodule. Patient states he and the provider are discussing next steps as he does not wish to have any procedure to address this. Annual CT order placed.

## 2024-01-19 LAB — CBC WITH DIFFERENTIAL/PLATELET
Basos: 1 %
EOS (ABSOLUTE): 0.1 10*3/uL (ref 0.0–0.2)
Eos: 4 %
Hematocrit: 49.7 % (ref 37.5–51.0)
Hemoglobin: 16.4 g/dL (ref 13.0–17.7)
Immature Granulocytes: 0 %
Immature Granulocytes: 0 10*3/uL (ref 0.0–0.1)
Lymphs: 36 %
MCH: 32.3 pg (ref 26.6–33.0)
MCHC: 33 g/dL (ref 31.5–35.7)
MCV: 98 fL — ABNORMAL HIGH (ref 79–97)
Monocytes Absolute: 0.2 10*3/uL (ref 0.0–0.4)
Monocytes Absolute: 0.6 10*3/uL (ref 0.1–0.9)
Monocytes: 11 %
Neutrophils Absolute: 2 10*3/uL (ref 0.7–3.1)
Neutrophils Absolute: 2.7 10*3/uL (ref 1.4–7.0)
Neutrophils: 48 %
Platelets: 275 10*3/uL (ref 150–450)
RBC: 5.08 x10E6/uL (ref 4.14–5.80)
RDW: 11.6 % (ref 11.6–15.4)
WBC: 5.6 10*3/uL (ref 3.4–10.8)

## 2024-01-19 LAB — CMP14+EGFR
ALT: 34 IU/L (ref 0–44)
AST: 25 IU/L (ref 0–40)
Albumin: 4.4 g/dL (ref 3.9–4.9)
Alkaline Phosphatase: 89 IU/L (ref 44–121)
BUN/Creatinine Ratio: 9 — ABNORMAL LOW (ref 10–24)
BUN: 11 mg/dL (ref 8–27)
Bilirubin Total: 0.6 mg/dL (ref 0.0–1.2)
CO2: 25 mmol/L (ref 20–29)
Calcium: 10.8 mg/dL — ABNORMAL HIGH (ref 8.6–10.2)
Chloride: 100 mmol/L (ref 96–106)
Creatinine, Ser: 1.21 mg/dL (ref 0.76–1.27)
Globulin, Total: 3.4 g/dL (ref 1.5–4.5)
Glucose: 89 mg/dL (ref 70–99)
Potassium: 4 mmol/L (ref 3.5–5.2)
Sodium: 141 mmol/L (ref 134–144)
Total Protein: 7.8 g/dL (ref 6.0–8.5)
eGFR: 68 mL/min/{1.73_m2} (ref >59)

## 2024-01-19 LAB — TSH+FREE T4
Free T4: 1.35 ng/dL (ref 0.82–1.77)
TSH: 1.05 u[IU]/mL (ref 0.450–4.500)

## 2024-01-19 LAB — LIPID PANEL
Chol/HDL Ratio: 3.4 ratio (ref 0.0–5.0)
Cholesterol, Total: 140 mg/dL (ref 100–199)
HDL: 41 mg/dL (ref >39)
LDL Chol Calc (NIH): 80 mg/dL (ref 0–99)
Triglycerides: 100 mg/dL (ref 0–149)
VLDL Cholesterol Cal: 19 mg/dL (ref 5–40)

## 2024-01-19 LAB — HEMOGLOBIN A1C
Est. average glucose Bld gHb Est-mCnc: 117 mg/dL
Hgb A1c MFr Bld: 5.7 % — ABNORMAL HIGH (ref 4.8–5.6)

## 2024-01-19 LAB — VITAMIN D 25 HYDROXY (VIT D DEFICIENCY, FRACTURES): Vit D, 25-Hydroxy: 34 ng/mL (ref 30.0–100.0)

## 2024-01-21 NOTE — Telephone Encounter (Signed)
 Spoke with Margretta Shi at pulmonary and she states the patient has been taken care of.

## 2024-01-29 ENCOUNTER — Other Ambulatory Visit: Payer: Self-pay | Admitting: Acute Care

## 2024-01-29 DIAGNOSIS — Z122 Encounter for screening for malignant neoplasm of respiratory organs: Secondary | ICD-10-CM

## 2024-01-29 DIAGNOSIS — Z87891 Personal history of nicotine dependence: Secondary | ICD-10-CM

## 2024-02-08 ENCOUNTER — Ambulatory Visit: Payer: Self-pay | Admitting: Family Medicine

## 2024-02-21 NOTE — Congregational Nurse Program (Unsigned)
 The patient visited the [facility] for a blood pressure check, and his readings were elevated 180/101, putting him in a dangerous range. I informed him about the risks associated with his blood pressure levels. He confirmed that he is taking his five prescribed medications as directed. I recommended that he get a blood test done today.  Apolinar Slade RN, MSN, DNP Congregational Nurse

## 2024-03-03 ENCOUNTER — Other Ambulatory Visit: Payer: Self-pay

## 2024-03-03 ENCOUNTER — Emergency Department (HOSPITAL_COMMUNITY)
Admission: EM | Admit: 2024-03-03 | Discharge: 2024-03-03 | Disposition: A | Discharge status: Home / Self Care | Attending: Emergency Medicine | Admitting: Emergency Medicine

## 2024-03-03 ENCOUNTER — Emergency Department (HOSPITAL_COMMUNITY)

## 2024-03-03 ENCOUNTER — Encounter (HOSPITAL_COMMUNITY): Payer: Self-pay | Admitting: Emergency Medicine

## 2024-03-03 DIAGNOSIS — Z5329 Procedure and treatment not carried out because of patient's decision for other reasons: Secondary | ICD-10-CM | POA: Insufficient documentation

## 2024-03-03 DIAGNOSIS — R059 Cough, unspecified: Secondary | ICD-10-CM | POA: Diagnosis not present

## 2024-03-03 DIAGNOSIS — I10 Essential (primary) hypertension: Secondary | ICD-10-CM | POA: Insufficient documentation

## 2024-03-03 DIAGNOSIS — J441 Chronic obstructive pulmonary disease with (acute) exacerbation: Secondary | ICD-10-CM | POA: Diagnosis not present

## 2024-03-03 DIAGNOSIS — Z7982 Long term (current) use of aspirin: Secondary | ICD-10-CM | POA: Insufficient documentation

## 2024-03-03 DIAGNOSIS — Z79899 Other long term (current) drug therapy: Secondary | ICD-10-CM | POA: Diagnosis not present

## 2024-03-03 LAB — COMPREHENSIVE METABOLIC PANEL WITH GFR
ALT: 35 U/L (ref 0–44)
AST: 31 U/L (ref 15–41)
Albumin: 4.1 g/dL (ref 3.5–5.0)
Alkaline Phosphatase: 71 U/L (ref 38–126)
Anion gap: 11 (ref 5–15)
BUN: 26 mg/dL — ABNORMAL HIGH (ref 8–23)
CO2: 25 mmol/L (ref 22–32)
Calcium: 10.1 mg/dL (ref 8.9–10.3)
Chloride: 101 mmol/L (ref 98–111)
Creatinine, Ser: 1.18 mg/dL (ref 0.61–1.24)
GFR, Estimated: >60 mL/min (ref >60)
Glucose, Bld: 125 mg/dL — ABNORMAL HIGH (ref 70–99)
Potassium: 2.9 mmol/L — ABNORMAL LOW (ref 3.5–5.1)
Sodium: 137 mmol/L (ref 135–145)
Total Bilirubin: 1 mg/dL (ref 0.0–1.2)
Total Protein: 8.2 g/dL — ABNORMAL HIGH (ref 6.5–8.1)

## 2024-03-03 LAB — CBC WITH DIFFERENTIAL/PLATELET
Abs Immature Granulocytes: 0.02 K/uL (ref 0.00–0.07)
Basophils Absolute: 0.1 K/uL (ref 0.0–0.1)
Basophils Relative: 1 %
Eosinophils Absolute: 0.4 K/uL (ref 0.0–0.5)
Eosinophils Relative: 5 %
HCT: 44.9 % (ref 39.0–52.0)
Hemoglobin: 14.8 g/dL (ref 13.0–17.0)
Immature Granulocytes: 0 %
Lymphocytes Relative: 28 %
Lymphs Abs: 2 K/uL (ref 0.7–4.0)
MCH: 31.5 pg (ref 26.0–34.0)
MCHC: 33 g/dL (ref 30.0–36.0)
MCV: 95.5 fL (ref 80.0–100.0)
Monocytes Absolute: 1.1 K/uL — ABNORMAL HIGH (ref 0.1–1.0)
Monocytes Relative: 15 %
Neutro Abs: 3.6 K/uL (ref 1.7–7.7)
Neutrophils Relative %: 51 %
Platelets: 291 K/uL (ref 150–400)
RBC: 4.7 MIL/uL (ref 4.22–5.81)
RDW: 13.2 % (ref 11.5–15.5)
WBC: 7.1 K/uL (ref 4.0–10.5)
nRBC: 0 % (ref 0.0–0.2)

## 2024-03-03 LAB — RESP PANEL BY RT-PCR (RSV, FLU A&B, COVID)  RVPGX2
Influenza A by PCR: NEGATIVE
Influenza B by PCR: NEGATIVE
Resp Syncytial Virus by PCR: NEGATIVE
SARS Coronavirus 2 by RT PCR: NEGATIVE

## 2024-03-03 LAB — BRAIN NATRIURETIC PEPTIDE: B Natriuretic Peptide: 8 pg/mL (ref 0.0–100.0)

## 2024-03-03 LAB — TROPONIN I (HIGH SENSITIVITY)
Troponin I (High Sensitivity): 5 ng/L (ref <18)
Troponin I (High Sensitivity): 4 ng/L (ref <18)

## 2024-03-03 MED ORDER — METHYLPREDNISOLONE SODIUM SUCC 125 MG IJ SOLR
125.0000 mg | Freq: Once | INTRAMUSCULAR | Status: AC
  Administered 2024-03-03: 125 mg via INTRAVENOUS
  Filled 2024-03-03: qty 2

## 2024-03-03 MED ORDER — MAGNESIUM SULFATE 2 GM/50ML IV SOLN
2.0000 g | Freq: Once | INTRAVENOUS | Status: AC
  Administered 2024-03-03: 2 g via INTRAVENOUS
  Filled 2024-03-03: qty 50

## 2024-03-03 MED ORDER — IPRATROPIUM-ALBUTEROL 0.5-2.5 (3) MG/3ML IN SOLN
3.0000 mL | Freq: Once | RESPIRATORY_TRACT | Status: AC
  Administered 2024-03-03: 3 mL via RESPIRATORY_TRACT
  Filled 2024-03-03: qty 3

## 2024-03-03 MED ORDER — SODIUM CHLORIDE 0.9 % IV BOLUS
500.0000 mL | Freq: Once | INTRAVENOUS | Status: AC
  Administered 2024-03-03: 500 mL via INTRAVENOUS

## 2024-03-03 MED ORDER — POTASSIUM CHLORIDE CRYS ER 20 MEQ PO TBCR
40.0000 meq | EXTENDED_RELEASE_TABLET | Freq: Once | ORAL | Status: AC
  Administered 2024-03-03: 40 meq via ORAL
  Filled 2024-03-03: qty 2

## 2024-03-03 MED ORDER — POTASSIUM CHLORIDE 10 MEQ/100ML IV SOLN
10.0000 meq | Freq: Once | INTRAVENOUS | Status: AC
  Administered 2024-03-03: 10 meq via INTRAVENOUS
  Filled 2024-03-03: qty 100

## 2024-03-03 NOTE — ED Provider Notes (Signed)
 [place] EMERGENCY DEPARTMENT AT [facility] Provider Note   CSN: [id number] Arrival date & time: 03/03/24  1512     Patient presents with: Cough   Antonio Small is a 63 y.o. male history of COPD, hypertension, hyperlipidemia presents with complaints of shortness of breath x 2 weeks.  Associated with a productive cough.  States he ran out of his inhalers.  Denies any chest pain.    Cough     Past Medical History:  Diagnosis Date   Chronic back pain    Hyperlipidemia    Hypertension    Past Surgical History:  Procedure Laterality Date   BACK SURGERY     HERNIA REPAIR Left    INGUINAL HERNIA REPAIR Right 05/13/2020   Procedure: RIGHT INGUINAL HERNIA REPAIR WITH MESH;  Surgeon: Vernetta Berg, MD;  Location: [facility];  Service: General;  Laterality: Right;   INSERTION OF MESH Right 05/13/2020   Procedure: INSERTION OF MESH;  Surgeon: Vernetta Berg, MD;  Location: [facility];  Service: General;  Laterality: Right;     Prior to Admission medications   Medication Sig Start Date End Date Taking? Authorizing Provider  albuterol  (VENTOLIN  HFA) 108 (90 Base) MCG/ACT inhaler Inhale 2 puffs into the lungs every 6 (six) hours as needed for wheezing or shortness of breath. 01/17/24   Zarwolo, Gloria, FNP  amLODipine  (NORVASC ) 10 MG tablet Take 1 tablet (10 mg total) by mouth daily. 09/17/23   Zarwolo, Gloria, FNP  aspirin EC 81 MG tablet Take 81 mg by mouth every morning.    [provider]  Blood Pressure Monitoring (COMFORT TOUCH BP CUFF/MEDIUM) MISC 1 each by Does not apply route daily. Patient not taking: Reported on 01/17/2024 09/26/21   Paseda, Folashade R, FNP  hydrALAZINE  (APRESOLINE ) 10 MG tablet Take 1 tablet (10 mg total) by mouth 3 (three) times daily. 09/17/23   Zarwolo, Gloria, FNP  hydrochlorothiazide  (HYDRODIURIL ) 25 MG tablet Take 1 tablet (25 mg total) by mouth daily. 09/17/23   Zarwolo, Gloria, FNP  olmesartan   (BENICAR ) 40 MG tablet Take 1 tablet (40 mg total) by mouth daily. 01/17/24   Zarwolo, Gloria, FNP  pravastatin  (PRAVACHOL ) 40 MG tablet TAKE 1 TABLET(40 MG) BY MOUTH EVERY MORNING Patient not taking: Reported on 01/17/2024 12/20/23   Zarwolo, Gloria, FNP  Vitamin D , Ergocalciferol , (DRISDOL ) 1.25 MG (50000 UNIT) CAPS capsule Take 1 capsule (50,000 Units total) by mouth every 7 (seven) days. Patient not taking: Reported on 01/17/2024 09/17/23   Zarwolo, Gloria, FNP    Allergies: Patient has no known allergies.    Review of Systems  Respiratory:  Positive for cough.     Updated Vital Signs BP (!) 132/94   Pulse 98   Temp 98.8 F (37.1 C) (Oral)   Ht 5' 8 (1.727 m)   Wt 88.5 kg   SpO2 95%   BMI 29.65 kg/m   Physical Exam Vitals and nursing note reviewed.  Constitutional:      General: He is not in acute distress.    Appearance: He is well-developed.  HENT:     Head: Normocephalic and atraumatic.  Eyes:     Conjunctiva/sclera: Conjunctivae normal.  Cardiovascular:     Rate and Rhythm: Normal rate and regular rhythm.     Heart sounds: No murmur heard. Pulmonary:     Comments: Increased respiratory effort with audible wheezes, no Rales appreciated on auscultation Abdominal:     Palpations: Abdomen is soft.  Tenderness: There is no abdominal tenderness.  Musculoskeletal:        General: No swelling.     Cervical back: Neck supple.  Skin:    General: Skin is warm and dry.     Capillary Refill: Capillary refill takes less than 2 seconds.  Neurological:     Mental Status: He is alert.  Psychiatric:        Mood and Affect: Mood normal.     (all labs ordered are listed, but only abnormal results are displayed) Labs Reviewed  CBC WITH DIFFERENTIAL/PLATELET - Abnormal; Notable for the following components:      Result Value   Monocytes Absolute 1.1 (*)    All other components within normal limits  COMPREHENSIVE METABOLIC PANEL WITH GFR - Abnormal; Notable for the  following components:   Potassium 2.9 (*)    Glucose, Bld 125 (*)    BUN 26 (*)    Total Protein 8.2 (*)    All other components within normal limits  RESP PANEL BY RT-PCR (RSV, FLU A&B, COVID)  RVPGX2  BRAIN NATRIURETIC PEPTIDE  TROPONIN I (HIGH SENSITIVITY)  TROPONIN I (HIGH SENSITIVITY)    EKG: EKG Interpretation Date/Time:  Monday March 03 2024 16:13:57 EDT Ventricular Rate:  93 PR Interval:  167 QRS Duration:  104 QT Interval:  377 QTC Calculation: 469 R Axis:   22  Text Interpretation: Sinus rhythm Confirmed by Melvenia Motto [phone]) on 03/03/2024 4:42:44 PM  Radiology: [facility] Chest 2 View Result Date: 03/03/2024 CLINICAL DATA:  Cough EXAM: CHEST - 2 VIEW COMPARISON:  Chest x-ray 06/26/2012 FINDINGS: The heart size and mediastinal contours are within normal limits. Both lungs are clear. The visualized skeletal structures are unremarkable. IMPRESSION: No active cardiopulmonary disease. Electronically Signed   By: Greig Pique M.D.   On: 03/03/2024 15:45     Procedures   Medications Ordered in the ED  methylPREDNISolone  sodium succinate (SOLU-MEDROL ) 125 mg/2 mL injection 125 mg (125 mg Intravenous Given 03/03/24 1611)  ipratropium-albuterol  (DUONEB) 0.5-2.5 (3) MG/3ML nebulizer solution 3 mL (3 mLs Nebulization Given 03/03/24 1619)  magnesium  sulfate IVPB 2 g 50 mL (0 g Intravenous Stopped 03/03/24 1715)  ipratropium-albuterol  (DUONEB) 0.5-2.5 (3) MG/3ML nebulizer solution 3 mL (3 mLs Nebulization Given 03/03/24 1618)  ipratropium-albuterol  (DUONEB) 0.5-2.5 (3) MG/3ML nebulizer solution 3 mL (3 mLs Nebulization Given 03/03/24 1617)  potassium chloride  10 mEq in 100 mL IVPB (0 mEq Intravenous Stopped 03/03/24 1834)  potassium chloride  SA (KLOR-CON  M) CR tablet 40 mEq (40 mEq Oral Given 03/03/24 1734)  sodium chloride  0.9 % bolus 500 mL (0 mLs Intravenous Stopped 03/03/24 1929)                                    Medical Decision Making Amount and/or Complexity of Data Reviewed Labs:  ordered. Radiology: ordered.  Risk Prescription drug management.   This patient presents to the ED with chief complaint(s) of shortness of breath.  The complaint involves an extensive differential diagnosis and also carries with it a high risk of complications and morbidity.   Pertinent past medical history as listed in HPI  The differential diagnosis includes  COPD, pneumonia, CHF, ACS, PE Additional history obtained: Records reviewed Care Everywhere/External Records  Assessment and management:   Hemodynamically stable patient presenting with complaints of shortness of breath 2 weeks.  He has known COPD and has been out of his inhaler.  He has  audible wheezes at bedside, with increased respiratory effort.  No rales appreciated.  He is not on oxygen at home.  Will initiate breathing treatments, chest x-ray and routine labs.  He has no cardiac history.  Patient reports improvement following breathing treatments.  His workup is overall reassuring.  Clinically is most consistent with a COPD exacerbation.  He is resting comfortably now at 92%.  With ambulating he drops down to 85%.  He is not on oxygen at home.  I recommended admission.  Patient states he needs a leave to take care of some things at home.  I have reviewed the risk of doing so and have strongly recommended him staying.  He is aware of these risks and will be leaving AMA.  Independent ECG interpretation:  Sinus rhythm, unchanged  Independent labs interpretation:  The following labs were independently interpreted:  CBC without leukocytosis, hemoglobin within normal limits, CMP with potassium of 2.9, BMP within normal limits, troponin without elevation  Independent visualization and interpretation of imaging: I independently visualized the following imaging with scope of interpretation limited to determining acute life threatening conditions related to emergency care:  Chest x-ray without acute cardiopulmonary  disease   Consultations obtained:   none  Disposition:   Patient left AMA.  He states he intends to return.  Social Determinants of Health:   none  This note was dictated with voice recognition software.  Despite best efforts at proofreading, errors may have occurred which can change the documentation meaning.       Final diagnoses:  COPD exacerbation [facility])    ED Discharge Orders     None          Donnajean Lynwood VEAR DEVONNA 03/03/24 1933    Melvenia Motto, MD 03/03/24 [phone]

## 2024-03-03 NOTE — Discharge Instructions (Addendum)
 You were evaluated in the emergency room for shortness of breath.  This was consistent with a COPD exacerbation.  Your sats dropped down below 85% this warrants admission.

## 2024-03-03 NOTE — ED Triage Notes (Signed)
 Pt report cough and chest congestion for over 2 weeks.  Has been using inhaler with no relief.

## 2024-03-03 NOTE — ED Notes (Signed)
 Ambulated pt, spO2 dropped from 90% to 85% while ambulating

## 2024-03-13 ENCOUNTER — Telehealth: Payer: Self-pay | Admitting: Family Medicine

## 2024-03-13 NOTE — Telephone Encounter (Signed)
 Patient was not admitted to the hospital- changing to regular office visit. Patient is on wait list, will be advised if anything sooner becomes available.

## 2024-03-13 NOTE — Telephone Encounter (Signed)
 Copied from CRM [id number]. Topic: Appointments - Scheduling Inquiry for Clinic >> Mar 13, 2024  8:23 AM Antwanette L wrote: Reason for CRM: Pt was discharged on 7/28 from [facility] at [facility] for a breathing treatment. Pt has a hospital f/u appt on 8/21 w/ Leita Longs. The patient has been added to the wait list but he needs to be seen before 8/21. The pt can be contacted at [phone]

## 2024-03-16 ENCOUNTER — Emergency Department (HOSPITAL_COMMUNITY)
Admission: EM | Admit: 2024-03-16 | Discharge: 2024-03-16 | Disposition: A | Discharge status: Home / Self Care | Attending: Emergency Medicine | Admitting: Emergency Medicine

## 2024-03-16 ENCOUNTER — Other Ambulatory Visit: Payer: Self-pay

## 2024-03-16 ENCOUNTER — Encounter (HOSPITAL_COMMUNITY): Payer: Self-pay | Admitting: Emergency Medicine

## 2024-03-16 ENCOUNTER — Emergency Department (HOSPITAL_COMMUNITY)

## 2024-03-16 DIAGNOSIS — R0602 Shortness of breath: Secondary | ICD-10-CM | POA: Diagnosis not present

## 2024-03-16 DIAGNOSIS — I10 Essential (primary) hypertension: Secondary | ICD-10-CM | POA: Insufficient documentation

## 2024-03-16 DIAGNOSIS — Z79899 Other long term (current) drug therapy: Secondary | ICD-10-CM | POA: Insufficient documentation

## 2024-03-16 DIAGNOSIS — Z7951 Long term (current) use of inhaled steroids: Secondary | ICD-10-CM | POA: Insufficient documentation

## 2024-03-16 DIAGNOSIS — Z7982 Long term (current) use of aspirin: Secondary | ICD-10-CM | POA: Diagnosis not present

## 2024-03-16 DIAGNOSIS — R944 Abnormal results of kidney function studies: Secondary | ICD-10-CM | POA: Diagnosis not present

## 2024-03-16 DIAGNOSIS — J441 Chronic obstructive pulmonary disease with (acute) exacerbation: Secondary | ICD-10-CM | POA: Diagnosis not present

## 2024-03-16 LAB — RESP PANEL BY RT-PCR (RSV, FLU A&B, COVID)  RVPGX2
Influenza A by PCR: NEGATIVE
Influenza B by PCR: NEGATIVE
Resp Syncytial Virus by PCR: NEGATIVE
SARS Coronavirus 2 by RT PCR: NEGATIVE

## 2024-03-16 LAB — COMPREHENSIVE METABOLIC PANEL WITH GFR
ALT: 39 U/L (ref 0–44)
AST: 29 U/L (ref 15–41)
Albumin: 3.8 g/dL (ref 3.5–5.0)
Alkaline Phosphatase: 67 U/L (ref 38–126)
Anion gap: 14 (ref 5–15)
BUN: 16 mg/dL (ref 8–23)
CO2: 25 mmol/L (ref 22–32)
Calcium: 10.1 mg/dL (ref 8.9–10.3)
Chloride: 98 mmol/L (ref 98–111)
Creatinine, Ser: 1.36 mg/dL — ABNORMAL HIGH (ref 0.61–1.24)
GFR, Estimated: 58 mL/min — ABNORMAL LOW (ref >60)
Glucose, Bld: 141 mg/dL — ABNORMAL HIGH (ref 70–99)
Potassium: 3.2 mmol/L — ABNORMAL LOW (ref 3.5–5.1)
Sodium: 137 mmol/L (ref 135–145)
Total Bilirubin: 0.7 mg/dL (ref 0.0–1.2)
Total Protein: 7.6 g/dL (ref 6.5–8.1)

## 2024-03-16 LAB — CBC
HCT: 44.9 % (ref 39.0–52.0)
Hemoglobin: 14.8 g/dL (ref 13.0–17.0)
MCH: 32.3 pg (ref 26.0–34.0)
MCHC: 33 g/dL (ref 30.0–36.0)
MCV: 98 fL (ref 80.0–100.0)
Platelets: 273 K/uL (ref 150–400)
RBC: 4.58 MIL/uL (ref 4.22–5.81)
RDW: 13.5 % (ref 11.5–15.5)
WBC: 8.5 K/uL (ref 4.0–10.5)
nRBC: 0 % (ref 0.0–0.2)

## 2024-03-16 LAB — TROPONIN I (HIGH SENSITIVITY)
Troponin I (High Sensitivity): 4 ng/L (ref <18)
Troponin I (High Sensitivity): 4 ng/L (ref <18)

## 2024-03-16 LAB — BRAIN NATRIURETIC PEPTIDE: B Natriuretic Peptide: 13 pg/mL (ref 0.0–100.0)

## 2024-03-16 MED ORDER — MAGNESIUM SULFATE 2 GM/50ML IV SOLN
2.0000 g | Freq: Once | INTRAVENOUS | Status: AC
  Administered 2024-03-16: 2 g via INTRAVENOUS
  Filled 2024-03-16: qty 50

## 2024-03-16 MED ORDER — PREDNISONE 10 MG (21) PO TBPK: ORAL_TABLET | Freq: Every day | ORAL | 0 refills | Status: DC

## 2024-03-16 MED ORDER — IPRATROPIUM-ALBUTEROL 0.5-2.5 (3) MG/3ML IN SOLN
3.0000 mL | Freq: Once | RESPIRATORY_TRACT | Status: AC
  Administered 2024-03-16: 3 mL via RESPIRATORY_TRACT
  Filled 2024-03-16: qty 3

## 2024-03-16 MED ORDER — DOXYCYCLINE HYCLATE 100 MG PO CAPS: 100.0000 mg | ORAL_CAPSULE | Freq: Two times a day (BID) | ORAL | 0 refills | Status: DC

## 2024-03-16 MED ORDER — IPRATROPIUM-ALBUTEROL 0.5-2.5 (3) MG/3ML IN SOLN
3.0000 mL | Freq: Once | RESPIRATORY_TRACT | Status: AC
Start: 2024-03-16 — End: 2024-03-16
  Administered 2024-03-16: 3 mL via RESPIRATORY_TRACT
  Filled 2024-03-16: qty 3

## 2024-03-16 MED ORDER — ALBUTEROL SULFATE HFA 108 (90 BASE) MCG/ACT IN AERS: 2.0000 | INHALATION_SPRAY | RESPIRATORY_TRACT | Status: DC | PRN

## 2024-03-16 MED ORDER — METHYLPREDNISOLONE SODIUM SUCC 125 MG IJ SOLR
125.0000 mg | Freq: Once | INTRAMUSCULAR | Status: AC
  Administered 2024-03-16: 125 mg via INTRAVENOUS
  Filled 2024-03-16: qty 2

## 2024-03-16 NOTE — ED Notes (Signed)
 Pt ambulated through department maintaining good o2 sats and stated he felt much better. No coughing

## 2024-03-16 NOTE — Discharge Instructions (Addendum)
 You were evaluated in the emergency room for shortness of breath.  Your lab work and imaging did not show any significant abnormality.  This was consistent with a COPD exacerbation.  Prescription for prednisone  and doxycycline  was sent to your pharmacy.  Please use your inhalers and nebulizers as discussed.  Please follow with your primary care doctor for further management.  Please drink plenty of fluids as it did appear that you were dehydrated.

## 2024-03-16 NOTE — ED Triage Notes (Signed)
 Pt via POV c/o SOB x 3-4 months, worse today. Former smoker. Heavy expiratory wheezes noted without stethoscope. Pt reports large amount of thick phlegm.

## 2024-03-16 NOTE — ED Provider Notes (Signed)
 [place] EMERGENCY DEPARTMENT AT [facility] Provider Note   CSN: [id number] Arrival date & time: 03/16/24  1621     Patient presents with: Shortness of Breath   Antonio Small is a 63 y.o. male history of COPD, hypertension, hyperlipidemia presents with complaints of shortness of breath.  This has been ongoing for the past couple weeks and is persisting.  His associated with a productive cough.  No sore throat, nasal congestion or fevers.  He denies any chest pain.  States he he no longer smokes.      Shortness of Breath  Past Medical History:  Diagnosis Date   Chronic back pain    Hyperlipidemia    Hypertension    Past Surgical History:  Procedure Laterality Date   BACK SURGERY     HERNIA REPAIR Left    INGUINAL HERNIA REPAIR Right 05/13/2020   Procedure: RIGHT INGUINAL HERNIA REPAIR WITH MESH;  Surgeon: Vernetta Berg, MD;  Location: [facility];  Service: General;  Laterality: Right;   INSERTION OF MESH Right 05/13/2020   Procedure: INSERTION OF MESH;  Surgeon: Vernetta Berg, MD;  Location: [facility];  Service: General;  Laterality: Right;       Prior to Admission medications   Medication Sig Start Date End Date Taking? Authorizing Provider  albuterol  (VENTOLIN  HFA) 108 (90 Base) MCG/ACT inhaler Inhale 2 puffs into the lungs every 6 (six) hours as needed for wheezing or shortness of breath. 01/17/24  Yes Zarwolo, Gloria, FNP  amLODipine  (NORVASC ) 10 MG tablet Take 1 tablet (10 mg total) by mouth daily. 09/17/23  Yes Zarwolo, Gloria, FNP  aspirin EC 81 MG tablet Take 81 mg by mouth every morning.   Yes [provider]  doxycycline  (VIBRAMYCIN ) 100 MG capsule Take 1 capsule (100 mg total) by mouth 2 (two) times daily. 03/16/24  Yes Donnajean Lynwood DEL, PA-C  hydrALAZINE  (APRESOLINE ) 10 MG tablet Take 1 tablet (10 mg total) by mouth 3 (three) times daily. 09/17/23  Yes Zarwolo, Gloria, FNP  hydrochlorothiazide   (HYDRODIURIL ) 25 MG tablet Take 1 tablet (25 mg total) by mouth daily. 09/17/23  Yes Zarwolo, Gloria, FNP  olmesartan  (BENICAR ) 40 MG tablet Take 1 tablet (40 mg total) by mouth daily. 01/17/24  Yes Zarwolo, Gloria, FNP  predniSONE  (STERAPRED UNI-PAK 21 TAB) 10 MG (21) TBPK tablet Take by mouth daily. Take 6 tabs by mouth daily  for 2 days, then 5 tabs for 2 days, then 4 tabs for 2 days, then 3 tabs for 2 days, 2 tabs for 2 days, then 1 tab by mouth daily for 2 days 03/16/24  Yes Donnajean Lynwood DEL, PA-C    Allergies: Patient has no known allergies.    Review of Systems  Respiratory:  Positive for shortness of breath.     Updated Vital Signs BP (!) 152/93 (BP Location: Right Arm)   Pulse 83   Temp 98.6 F (37 C) (Tympanic)   Resp (!) 24   Ht 5' 8 (1.727 m)   Wt 86.2 kg   SpO2 93%   BMI 28.89 kg/m   Physical Exam Vitals and nursing note reviewed.  Constitutional:      General: He is not in acute distress.    Appearance: He is well-developed.  HENT:     Head: Normocephalic and atraumatic.  Eyes:     Conjunctiva/sclera: Conjunctivae normal.  Cardiovascular:     Rate and Rhythm: Normal rate and regular rhythm.     Heart  sounds: No murmur heard. Pulmonary:     Effort: No respiratory distress.     Comments: Diffuse audible expiratory wheeze with increased respiratory effort, no rales Abdominal:     Palpations: Abdomen is soft.     Tenderness: There is no abdominal tenderness.  Musculoskeletal:        General: No swelling.     Cervical back: Neck supple.  Skin:    General: Skin is warm and dry.     Capillary Refill: Capillary refill takes less than 2 seconds.  Neurological:     Mental Status: He is alert.  Psychiatric:        Mood and Affect: Mood normal.     (all labs ordered are listed, but only abnormal results are displayed) Labs Reviewed  COMPREHENSIVE METABOLIC PANEL WITH GFR - Abnormal; Notable for the following components:      Result Value   Potassium 3.2 (*)     Glucose, Bld 141 (*)    Creatinine, Ser 1.36 (*)    GFR, Estimated 58 (*)    All other components within normal limits  RESP PANEL BY RT-PCR (RSV, FLU A&B, COVID)  RVPGX2  CBC  BRAIN NATRIURETIC PEPTIDE  TROPONIN I (HIGH SENSITIVITY)  TROPONIN I (HIGH SENSITIVITY)    EKG: EKG Interpretation Date/Time:  Sunday March 16 2024 16:31:42 EDT Ventricular Rate:  95 PR Interval:  162 QRS Duration:  96 QT Interval:  350 QTC Calculation: 439 R Axis:   51  Text Interpretation: Normal sinus rhythm Normal ECG When compared with ECG of 03-Mar-2024 16:13, No significant change since last tracing Confirmed by Towana Sharper [phone]) on 03/16/2024 4:38:24 PM  Radiology: DG Chest 2 View Result Date: 03/16/2024 CLINICAL DATA:  SOB EXAM: CHEST - 2 VIEW COMPARISON:  March 03, 2024, December 04, 2023 FINDINGS: No focal airspace consolidation, pleural effusion, or pneumothorax. No cardiomegaly. No acute fracture or destructive lesion. IMPRESSION: No acute cardiopulmonary abnormality. Electronically Signed   By: Rogelia Myers M.D.   On: 03/16/2024 16:59     Procedures   Medications Ordered in the ED  ipratropium-albuterol  (DUONEB) 0.5-2.5 (3) MG/3ML nebulizer solution 3 mL (3 mLs Nebulization Given 03/16/24 1727)  ipratropium-albuterol  (DUONEB) 0.5-2.5 (3) MG/3ML nebulizer solution 3 mL (3 mLs Nebulization Given 03/16/24 1728)  ipratropium-albuterol  (DUONEB) 0.5-2.5 (3) MG/3ML nebulizer solution 3 mL (3 mLs Nebulization Given 03/16/24 1727)  methylPREDNISolone  sodium succinate (SOLU-MEDROL ) 125 mg/2 mL injection 125 mg (125 mg Intravenous Given 03/16/24 1728)  magnesium  sulfate IVPB 2 g 50 mL (0 g Intravenous Stopped 03/16/24 1828)                                    Medical Decision Making Amount and/or Complexity of Data Reviewed Labs: ordered. Radiology: ordered.  Risk Prescription drug management.   This patient presents to the ED with chief complaint(s) of SOB .  The complaint involves an  extensive differential diagnosis and also carries with it a high risk of complications and morbidity.   Pertinent past medical history as listed in HPI  The differential diagnosis includes  COPD exacerbation, URI, pneumonia, ACS and PE Additional history obtained: Records reviewed Care Everywhere/External Records  Assessment and management:   Patient presents with complaints of shortness of breath.  This has been ongoing for the past couple weeks.  Is not associate with a productive cough, no fevers, no chills.  On exam he has audible expiratory wheezes  diffusely without Rales.  Denies any chest pain.  Has no cardiac history. Was evaluated here previously on 7/28.  Was consistent with a COPD exacerbation with plan for admission.  Patient left AMA and he is here today for repeat evaluation.  Will begin with breathing treatments, labs, chest x-ray and EKG  Workup overall reassuring.  Patient tolerating p.o. will give p.o. fluids for mildly elevated creatinine.  He reports marked improvement following breathing treatments.  He is able to ambulate with maintaining adequate saturations.  Most consistent with COPD exacerbation.  He is able to go home.  Will send in prescription for prednisone  and doxycycline .  He has an appointment with his primary care doctor in the upcoming week.  Independent ECG interpretation:  Normal sinus rhythm  Independent labs interpretation:  The following labs were independently interpreted:  Respiratory panel negative, BMP, troponin within normal limits, CBC unremarkable, CMP with mildly elevated creatinine  Independent visualization and interpretation of imaging: I independently visualized the following imaging with scope of interpretation limited to determining acute life threatening conditions related to emergency care: X-ray without acute cardiopulmonary disease   Consultations obtained:   None  Disposition:   Patient will be discharged home. The patient has been  appropriately medically screened and/or stabilized in the ED. I have low suspicion for any other emergent medical condition which would require further screening, evaluation or treatment in the ED or require inpatient management. At time of discharge the patient is hemodynamically stable and in no acute distress. I have discussed work-up results and diagnosis with patient and answered all questions. Patient is agreeable with discharge plan. We discussed strict return precautions for returning to the emergency department and they verbalized understanding.     Social Determinants of Health:   none  This note was dictated with voice recognition software.  Despite best efforts at proofreading, errors may have occurred which can change the documentation meaning.       Final diagnoses:  COPD exacerbation [facility])    ED Discharge Orders          Ordered    predniSONE  (STERAPRED UNI-PAK 21 TAB) 10 MG (21) TBPK tablet  Daily        03/16/24 1859    doxycycline  (VIBRAMYCIN ) 100 MG capsule  2 times daily        03/16/24 1859               Donnajean Lynwood DEL, PA-C 03/16/24 1901    Towana Ozell BROCKS, MD 03/17/24 1059

## 2024-03-21 ENCOUNTER — Telehealth: Payer: Self-pay

## 2024-03-21 NOTE — Telephone Encounter (Signed)
 Copied from CRM #[id number]. Topic: General - Call Back - No Documentation >> Mar 21, 2024 12:58 PM Harlene ORN wrote: Reason for CRM: Patient is returning call from office. Please call back the patient.

## 2024-03-24 NOTE — Telephone Encounter (Signed)
 No note on who tried calling patient

## 2024-03-27 ENCOUNTER — Ambulatory Visit (INDEPENDENT_AMBULATORY_CARE_PROVIDER_SITE_OTHER)

## 2024-03-27 VITALS — BP 158/97 | HR 87 | Ht 68.0 in | Wt 192.1 lb

## 2024-03-27 DIAGNOSIS — I10 Essential (primary) hypertension: Secondary | ICD-10-CM

## 2024-03-27 DIAGNOSIS — Z23 Encounter for immunization: Secondary | ICD-10-CM | POA: Diagnosis not present

## 2024-03-27 DIAGNOSIS — J441 Chronic obstructive pulmonary disease with (acute) exacerbation: Secondary | ICD-10-CM | POA: Diagnosis not present

## 2024-03-27 MED ORDER — TRELEGY ELLIPTA 100-62.5-25 MCG/ACT IN AEPB: 1.0000 | INHALATION_SPRAY | Freq: Every day | RESPIRATORY_TRACT | 11 refills | Status: AC

## 2024-03-27 MED ORDER — NEBULIZERS MISC: 1.0000 [IU] | 0 refills | Status: AC | PRN

## 2024-03-27 MED ORDER — HYDROCHLOROTHIAZIDE 25 MG PO TABS: 25.0000 mg | ORAL_TABLET | Freq: Every day | ORAL | 1 refills | Status: AC

## 2024-03-27 MED ORDER — NYSTATIN 100000 UNIT/ML MT SUSP: 5.0000 mL | Freq: Four times a day (QID) | OROMUCOSAL | 5 refills | Status: AC

## 2024-03-27 MED ORDER — HYDROCHLOROTHIAZIDE 25 MG PO TABS: 25.0000 mg | ORAL_TABLET | Freq: Every day | ORAL | 1 refills | Status: DC

## 2024-03-27 MED ORDER — ALBUTEROL SULFATE (2.5 MG/3ML) 0.083% IN NEBU: 2.5000 mg | INHALATION_SOLUTION | Freq: Four times a day (QID) | RESPIRATORY_TRACT | 12 refills | Status: AC | PRN

## 2024-03-27 NOTE — Progress Notes (Signed)
 Established Patient Office Visit  Subjective   Patient ID: Antonio Small, male    DOB: 05-09-1961  Age: 63 y.o. MRN: [id number]  Chief Complaint  Patient presents with   Medical Management of Chronic Issues    Pt would like to discuss a nebulizer machine for at home    HPI Discussed the use of AI scribe software for clinical note transcription with the patient, who gave verbal consent to proceed.  History of Present Illness    Antonio Small is a 63 year old male with COPD who presents with difficulty breathing following a recent ER visit to [facility] on 03/16/24 for SOB.    Dyspnea and functional impairment - Persistent difficulty breathing since recent ER visit - Significant impairment in breathing, preventing work and physical activity - Marked shortness of breath with minimal exertion, such as walking 200 yards - No prior episodes of this duration, though family notes previous shorter episodes - Difficulty sleeping at night due to breathing issues  Chronic obstructive pulmonary disease management - History of COPD - Recent ER visit included administration of breathing treatments and steroids - Currently using a rescue inhaler with two refills available - Using brother's nebulizer machine with a separate mouthpiece - Desires to obtain a personal nebulizer for home use - Interested in initiating a daily inhaler regimen for improved symptom control  Tobacco use history - History of smoking, quit over two years ago  Immunization status - Interested in receiving pneumonia vaccine and influenza vaccine - Has not yet received either vaccine  Medication concerns and refill needs - Currently taking hydrochlorothiazide  and requires a refill - Expresses concern about medication side effects due to prior experiences with cancer treatments      Patient Active Problem List   Diagnosis Date Noted   Exercise-induced shortness of breath 01/17/2024   Vitamin D  deficiency 09/17/2023    Depression, major, single episode, moderate (HCC) 03/07/2022   Overweight (BMI 25.0-29.9) 03/07/2022   Hypercalcemia 03/07/2022   Trigger finger, right ring finger 03/07/2022   Immunization refused 10/27/2021   Urinary frequency 10/27/2021   Encounter for annual general medical examination with abnormal findings in adult 09/26/2021   Immunization due 04/21/2021   Screening due 02/15/2021   Tobacco use 10/12/2006   HLD (hyperlipidemia) 09/03/2006   Hypertension, essential 09/03/2006   LOW BACK PAIN 09/03/2006      ROS    Objective:     BP (!) 158/97   Pulse 87   Ht 5' 8 (1.727 m)   Wt 192 lb 1.9 oz (87.1 kg)   SpO2 93%   BMI 29.21 kg/m  BP Readings from Last 3 Encounters:  03/27/24 (!) 158/97  03/16/24 (!) 146/78  03/03/24 (!) 132/94   Wt Readings from Last 3 Encounters:  03/27/24 192 lb 1.9 oz (87.1 kg)  03/16/24 190 lb (86.2 kg)  03/03/24 195 lb (88.5 kg)     Physical Exam Vitals and nursing note reviewed.  Constitutional:      Appearance: Normal appearance.  HENT:     Head: Normocephalic.  Eyes:     Extraocular Movements: Extraocular movements intact.     Pupils: Pupils are equal, round, and reactive to light.  Cardiovascular:     Rate and Rhythm: Normal rate and regular rhythm.  Pulmonary:     Effort: Pulmonary effort is normal.     Breath sounds: Examination of the right-upper field reveals wheezing. Examination of the right-middle field reveals wheezing and rhonchi. Examination of  the left-middle field reveals wheezing and rhonchi. Wheezing and rhonchi present. No decreased breath sounds.  Musculoskeletal:     Cervical back: Normal range of motion and neck supple.  Neurological:     Mental Status: He is alert and oriented to person, place, and time.  Psychiatric:        Mood and Affect: Mood normal.        Thought Content: Thought content normal.      No results found for any visits on 03/27/24.    The 10-year ASCVD risk score (Arnett DK,  et al., 2019) is: 20.7%    Assessment & Plan:   Problem List Items Addressed This Visit       Cardiovascular and Mediastinum   Hypertension, essential   Relevant Medications   hydrochlorothiazide  (HYDRODIURIL ) 25 MG tablet   Other Visit Diagnoses       Chronic obstructive pulmonary disease with acute exacerbation (HCC)    -  Primary   Relevant Medications   Fluticasone-Umeclidin-Vilant (TRELEGY ELLIPTA ) 100-62.5-25 MCG/ACT AEPB   albuterol  (PROVENTIL ) (2.5 MG/3ML) 0.083% nebulizer solution   Nebulizers MISC     Need for pneumococcal vaccination       Relevant Orders   Pneumococcal conjugate vaccine 20-valent (Prevnar 20)     Encounter for immunization       Relevant Orders   Pneumococcal conjugate vaccine 20-valent (Completed)      Assessment and Plan    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 Former smoker, abstinent for over two years. - Prescribe daily inhaler (Trelegy) for COPD and asthma management. - Instruct to rinse mouth thoroughly after using Trelegy to prevent oral candidiasis. - Prescribe nebulizer and send order to [facility]. - Send nebulizer solution prescription to [facility]. - Administer pneumonia vaccine today.  Hypertension Hypertension managed with hydrochlorothiazide . - Refill hydrochlorothiazide  prescription at [facility].  General Health Maintenance Discussed need for pneumonia vaccine and flu shot. RSV vaccine discussed but not available at this location. - Administer pneumonia vaccine today. - Schedule flu shot for the first week of September. - Advise to obtain RSV vaccine from pharmacy.       No follow-ups on file.    Leita Longs, FNP

## 2024-03-28 NOTE — Progress Notes (Signed)
 Pharmacy Quality Measure Review  This patient is appearing on a report for being at risk of failing the adherence measure for cholesterol (statin) medications this calendar year.   Medication: pravastatin  40 mg daily Last fill date: 03/23/24 for 90 day supply  Insurance report was not up to date. No action needed at this time.    Woodie Jock, PharmD PGY1 Pharmacy Resident

## 2024-03-31 ENCOUNTER — Other Ambulatory Visit: Payer: Self-pay | Admitting: Family Medicine

## 2024-03-31 DIAGNOSIS — I10 Essential (primary) hypertension: Secondary | ICD-10-CM

## 2024-03-31 MED ORDER — OLMESARTAN MEDOXOMIL 40 MG PO TABS: 40.0000 mg | ORAL_TABLET | Freq: Every day | ORAL | 1 refills | Status: AC

## 2024-03-31 NOTE — Telephone Encounter (Signed)
 Copied from CRM [id number]. Topic: Clinical - Medication Refill >> Mar 31, 2024  2:17 PM Zebedee SAUNDERS wrote: Medication: olmesartan  (BENICAR ) 40 MG tablet  Has the patient contacted their pharmacy? Yes (Agent: If no, request that the patient contact the pharmacy for the refill. If patient does not wish to contact the pharmacy document the reason why and proceed with request.) (Agent: If yes, when and what did the pharmacy advise?)  This is the patient's preferred pharmacy:  [id number] - [place], [place] - [address] AT [address] [place] [address] Phone: [phone] Fax: [phone]  Is this the correct pharmacy for this prescription? Yes If no, delete pharmacy and type the correct one.   Has the prescription been filled recently? Yes  Is the patient out of the medication? Yes  Has the patient been seen for an appointment in the last year OR does the patient have an upcoming appointment? Yes  Can we respond through MyChart? Yes  Agent: Please be advised that Rx refills may take up to 3 business days. We ask that you follow-up with your pharmacy.

## 2024-04-08 ENCOUNTER — Telehealth: Payer: Self-pay

## 2024-04-08 NOTE — Telephone Encounter (Signed)
 Copied from CRM #[id number]. Topic: General - Other >> Apr 08, 2024 12:30 PM Cleave MATSU wrote: Reason for CRM: [employer] called and stated that the neubalizer was sent over and the pt just got one on the 21st they confused on what they should do about this. Please follow up with [facility] [phone]

## 2024-04-14 NOTE — Telephone Encounter (Signed)
 Mandy not available

## 2024-04-28 ENCOUNTER — Ambulatory Visit (INDEPENDENT_AMBULATORY_CARE_PROVIDER_SITE_OTHER)

## 2024-04-28 DIAGNOSIS — Z23 Encounter for immunization: Secondary | ICD-10-CM | POA: Diagnosis not present

## 2024-04-28 NOTE — Progress Notes (Signed)
 Patient is in office today for a nurse visit for FLU SHOT. Patient Injection was given in the  Right deltoid. Patient tolerated injection well.

## 2024-05-06 ENCOUNTER — Ambulatory Visit

## 2024-05-19 ENCOUNTER — Encounter: Payer: Self-pay | Admitting: Family Medicine

## 2024-05-19 ENCOUNTER — Ambulatory Visit (INDEPENDENT_AMBULATORY_CARE_PROVIDER_SITE_OTHER): Admitting: Family Medicine

## 2024-05-19 ENCOUNTER — Other Ambulatory Visit: Payer: Self-pay | Admitting: Family Medicine

## 2024-05-19 VITALS — BP 134/84 | HR 100 | Resp 18 | Ht 68.0 in | Wt 195.0 lb

## 2024-05-19 DIAGNOSIS — I10 Essential (primary) hypertension: Secondary | ICD-10-CM

## 2024-05-19 DIAGNOSIS — E7849 Other hyperlipidemia: Secondary | ICD-10-CM

## 2024-05-19 DIAGNOSIS — E559 Vitamin D deficiency, unspecified: Secondary | ICD-10-CM

## 2024-05-19 DIAGNOSIS — E038 Other specified hypothyroidism: Secondary | ICD-10-CM | POA: Diagnosis not present

## 2024-05-19 DIAGNOSIS — R7301 Impaired fasting glucose: Secondary | ICD-10-CM | POA: Diagnosis not present

## 2024-05-19 MED ORDER — HYDRALAZINE HCL 10 MG PO TABS: 10.0000 mg | ORAL_TABLET | Freq: Three times a day (TID) | ORAL | 1 refills | Status: AC

## 2024-05-19 NOTE — Assessment & Plan Note (Signed)
 Encouraged to increase his intake of vitamin D-rich foods such as fatty fish (e.g., salmon, mackerel, and sardines), fortified dairy products, egg yolks, and fortified cereals.

## 2024-05-19 NOTE — Progress Notes (Signed)
 Established Patient Office Visit  Subjective:  Patient ID: Antonio Small, male    DOB: 16-Sep-1960  Age: 63 y.o. MRN: [id number]  CC:  Chief Complaint  Patient presents with   Hypertension    4 month follow up     HPI Antonio Small is a 63 y.o. male with past medical history of hypertension, hyperlipidemia, vitamin D  deficiency presents for f/u of  chronic medical conditions.  For the details of today's visit, please refer to the assessment and plan.     Past Medical History:  Diagnosis Date   Chronic back pain    Hyperlipidemia    Hypertension     Past Surgical History:  Procedure Laterality Date   BACK SURGERY     HERNIA REPAIR Left    INGUINAL HERNIA REPAIR Right 05/13/2020   Procedure: RIGHT INGUINAL HERNIA REPAIR WITH MESH;  Surgeon: Vernetta Berg, MD;  Location: [facility];  Service: General;  Laterality: Right;   INSERTION OF MESH Right 05/13/2020   Procedure: INSERTION OF MESH;  Surgeon: Vernetta Berg, MD;  Location: [facility];  Service: General;  Laterality: Right;    Family History  Problem Relation Age of Onset   Hypertension Mother    Hypertension Father    Hypertension Sister     Social History   Socioeconomic History   Marital status: Legally Separated    Spouse name: Not on file   Number of children: 1   Years of education: Not on file   Highest education level: Not on file  Occupational History   Occupation: Disabled    Comment: d/t back pain  Tobacco Use   Smoking status: Former    Current packs/day: 0.00    Average packs/day: 0.5 packs/day for 40.0 years (20.0 ttl pk-yrs)    Types: Cigarettes    Start date: 08/07/1982    Quit date: 08/07/2022    Years since quitting: 1.7   Smokeless tobacco: Never  Vaping Use   Vaping status: Never Used  Substance and Sexual Activity   Alcohol use: Not Currently    Alcohol/week: 2.0 standard drinks of alcohol    Types: 2 Cans of beer per week    Comment:  occassionally   Drug use: No   Sexual activity: Yes  Other Topics Concern   Not on file  Social History Narrative   1 daughter   Social Drivers of Corporate investment banker Strain: Not on file  Food Insecurity: Not on file  Transportation Needs: Not on file  Physical Activity: Not on file  Stress: Not on file  Social Connections: Not on file  Intimate Partner Violence: Not on file    Outpatient Medications Prior to Visit  Medication Sig Dispense Refill   albuterol  (PROVENTIL ) (2.5 MG/3ML) 0.083% nebulizer solution Take 3 mLs (2.5 mg total) by nebulization every 6 (six) hours as needed for wheezing or shortness of breath. 75 mL 12   albuterol  (VENTOLIN  HFA) 108 (90 Base) MCG/ACT inhaler Inhale 2 puffs into the lungs every 6 (six) hours as needed for wheezing or shortness of breath. 8 g 2   amLODipine  (NORVASC ) 10 MG tablet Take 1 tablet (10 mg total) by mouth daily. 90 tablet 3   aspirin EC 81 MG tablet Take 81 mg by mouth every morning.     Fluticasone-Umeclidin-Vilant (TRELEGY ELLIPTA ) 100-62.5-25 MCG/ACT AEPB Inhale 1 puff into the lungs daily. 1 each 11   hydrochlorothiazide  (HYDRODIURIL ) 25 MG tablet Take 1 tablet (  25 mg total) by mouth daily. 90 tablet 1   nystatin  (MYCOSTATIN ) 100000 UNIT/ML suspension Take 5 mLs (500,000 Units total) by mouth 4 (four) times daily. 60 mL 5   olmesartan  (BENICAR ) 40 MG tablet Take 1 tablet (40 mg total) by mouth daily. 90 tablet 1   pravastatin  (PRAVACHOL ) 40 MG tablet Take 40 mg by mouth daily.     hydrALAZINE  (APRESOLINE ) 10 MG tablet Take 1 tablet (10 mg total) by mouth 3 (three) times daily. 120 tablet 1   doxycycline  (VIBRAMYCIN ) 100 MG capsule Take 1 capsule (100 mg total) by mouth 2 (two) times daily. 10 capsule 0   Nebulizers MISC 1 Units by Does not apply route every 4 (four) hours as needed (wheezing). 1 Units 0   predniSONE  (STERAPRED UNI-PAK 21 TAB) 10 MG (21) TBPK tablet Take by mouth daily. Take 6 tabs by mouth daily  for 2 days,  then 5 tabs for 2 days, then 4 tabs for 2 days, then 3 tabs for 2 days, 2 tabs for 2 days, then 1 tab by mouth daily for 2 days 42 tablet 0   No facility-administered medications prior to visit.    No Known Allergies  ROS Review of Systems  Constitutional:  Negative for fatigue and fever.  Eyes:  Negative for visual disturbance.  Respiratory:  Negative for chest tightness and shortness of breath.   Cardiovascular:  Negative for chest pain and palpitations.  Neurological:  Negative for dizziness and headaches.      Objective:    Physical Exam HENT:     Head: Normocephalic.     Right Ear: External ear normal.     Left Ear: External ear normal.     Nose: No congestion or rhinorrhea.     Mouth/Throat:     Mouth: Mucous membranes are moist.  Cardiovascular:     Rate and Rhythm: Regular rhythm.     Heart sounds: No murmur heard. Pulmonary:     Effort: No respiratory distress.     Breath sounds: Normal breath sounds.  Neurological:     Mental Status: He is alert.     BP 134/84   Pulse 100   Resp 18   Ht 5' 8 (1.727 m)   Wt 195 lb 0.6 oz (88.5 kg)   SpO2 92%   BMI 29.66 kg/m  Wt Readings from Last 3 Encounters:  05/19/24 195 lb 0.6 oz (88.5 kg)  03/27/24 192 lb 1.9 oz (87.1 kg)  03/16/24 190 lb (86.2 kg)    Lab Results  Component Value Date   TSH 1.050 01/17/2024   Lab Results  Component Value Date   WBC 8.5 03/16/2024   HGB 14.8 03/16/2024   HCT 44.9 03/16/2024   MCV 98.0 03/16/2024   PLT 273 03/16/2024   Lab Results  Component Value Date   NA 137 03/16/2024   K 3.2 (L) 03/16/2024   CO2 25 03/16/2024   GLUCOSE 141 (H) 03/16/2024   BUN 16 03/16/2024   CREATININE 1.36 (H) 03/16/2024   BILITOT 0.7 03/16/2024   ALKPHOS 67 03/16/2024   AST 29 03/16/2024   ALT 39 03/16/2024   PROT 7.6 03/16/2024   ALBUMIN 3.8 03/16/2024   CALCIUM 10.1 03/16/2024   ANIONGAP 14 03/16/2024   EGFR 68 01/17/2024   Lab Results  Component Value Date   CHOL 140  01/17/2024   Lab Results  Component Value Date   HDL 41 01/17/2024   Lab Results  Component Value Date  LDLCALC 80 01/17/2024   Lab Results  Component Value Date   TRIG 100 01/17/2024   Lab Results  Component Value Date   CHOLHDL 3.4 01/17/2024   Lab Results  Component Value Date   HGBA1C 5.7 (H) 01/17/2024      Assessment & Plan:  Hypertension, essential Assessment & Plan: Controlled  The patient reports compliance with amlodipine  10 mg daily, olmesartan  40 mg daily, hydrochlorothiazide  25 mg daily, and hydralazine  10 mg three times daily. No changes to treatment regimen made today A low-sodium diet of less than 2,300 mg daily is recommended, along with moderate-intensity physical activity, aiming for 150 minutes per week. The patient is encouraged to continue these lifestyle modifications to help manage his blood pressure effectively.  BP Readings from Last 3 Encounters:  05/19/24 134/84  03/27/24 (!) 158/97  03/16/24 (!) 146/78     Orders: -     hydrALAZINE  HCl; Take 1 tablet (10 mg total) by mouth 3 (three) times daily.  Dispense: 120 tablet; Refill: 1  Other hyperlipidemia Assessment & Plan: The patient reports compliance with pravastatin  40 mg daily. The patient was encouraged to make lifestyle changes, including avoiding simple carbohydrates such as cakes, sweet desserts, ice cream, soda (diet or regular), sweet tea, candies, chips, cookies, store-bought juices, excessive alcohol (more than 1-2 drinks per day), lemonade, artificial sweeteners, donuts, coffee creamers, and sugar-free products. Additionally, reducing the consumption of greasy, fatty foods and increasing physical activity were recommended. The patient verbalized understanding and is aware of the plan of care.   Orders: -     Lipid panel -     CMP14+EGFR -     CBC with Differential/Platelet  Vitamin D  deficiency Assessment & Plan: Encouraged to increase his intake of vitamin D -rich foods  such as fatty fish (e.g., salmon, mackerel, and sardines), fortified dairy products, egg yolks, and fortified cereals.   Orders: -     VITAMIN D  25 Hydroxy (Vit-D Deficiency, Fractures)  IFG (impaired fasting glucose) -     Hemoglobin A1c  TSH (thyroid -stimulating hormone deficiency) -     TSH + free T4  Note: This chart has been completed using Engineer, civil (consulting) software, and while attempts have been made to ensure accuracy, certain words and phrases may not be transcribed as intended.    Follow-up: Return in about 4 months (around 09/19/2024).   Jilberto Vanderwall  Z Bacchus, FNP

## 2024-05-19 NOTE — Patient Instructions (Addendum)
 I appreciate the opportunity to provide care to you today!    Follow up:  4 months  Labs: please stop by the lab today to get your blood drawn (CBC, CMP, TSH, Lipid profile, HgA1c, Vit D)  Schedule medicare annual wellness visit  For a Healthier YOU, I Recommend: Reducing your intake of sugar, sodium, carbohydrates, and saturated fats. Increasing your fiber intake by incorporating more whole grains, fruits, and vegetables into your meals. Setting healthy goals with a focus on lowering your consumption of carbs, sugar, and unhealthy fats. Adding variety to your diet by including a wide range of fruits and vegetables. Cutting back on soda and limiting processed foods as much as possible. Staying active: In addition to taking your weight loss medication, aim for at least 150 minutes of moderate-intensity physical activity each week for optimal results.    Please follow up if your symptoms worsen or fail to improve.    Please continue to a heart-healthy diet and increase your physical activities. Try to exercise for at least five days a week.    It was a pleasure to see you and I look forward to continuing to work together on your health and well-being. Please do not hesitate to call the office if you need care or have questions about your care.  In case of emergency, please visit the Emergency Department for urgent care, or contact our clinic at [phone] to schedule an appointment. We're here to help you!   Have a wonderful day and week. With Gratitude, Meade JENEANE Gerlach MSN, FNP-BC, PMHNP-BC

## 2024-05-19 NOTE — Assessment & Plan Note (Signed)
 Controlled  The patient reports compliance with amlodipine  10 mg daily, olmesartan  40 mg daily, hydrochlorothiazide  25 mg daily, and hydralazine  10 mg three times daily. No changes to treatment regimen made today A low-sodium diet of less than 2,300 mg daily is recommended, along with moderate-intensity physical activity, aiming for 150 minutes per week. The patient is encouraged to continue these lifestyle modifications to help manage his blood pressure effectively.  BP Readings from Last 3 Encounters:  05/19/24 134/84  03/27/24 (!) 158/97  03/16/24 (!) 146/78

## 2024-05-19 NOTE — Assessment & Plan Note (Signed)
 The patient reports compliance with pravastatin  40 mg daily. The patient was encouraged to make lifestyle changes, including avoiding simple carbohydrates such as cakes, sweet desserts, ice cream, soda (diet or regular), sweet tea, candies, chips, cookies, store-bought juices, excessive alcohol (more than 1-2 drinks per day), lemonade, artificial sweeteners, donuts, coffee creamers, and sugar-free products. Additionally, reducing the consumption of greasy, fatty foods and increasing physical activity were recommended. The patient verbalized understanding and is aware of the plan of care.

## 2024-05-20 LAB — CBC WITH DIFFERENTIAL/PLATELET
Basophils Absolute: 0.1 x10E3/uL (ref 0.0–0.2)
Basos: 1 %
EOS (ABSOLUTE): 0.4 x10E3/uL (ref 0.0–0.4)
Eos: 7 %
Hematocrit: 47.3 % (ref 37.5–51.0)
Hemoglobin: 15.6 g/dL (ref 13.0–17.7)
Immature Grans (Abs): 0 x10E3/uL (ref 0.0–0.1)
Immature Granulocytes: 0 %
Lymphocytes Absolute: 1.6 x10E3/uL (ref 0.7–3.1)
Lymphs: 28 %
MCH: 32 pg (ref 26.6–33.0)
MCHC: 33 g/dL (ref 31.5–35.7)
MCV: 97 fL (ref 79–97)
Monocytes Absolute: 0.8 x10E3/uL (ref 0.1–0.9)
Monocytes: 13 %
Neutrophils Absolute: 2.9 x10E3/uL (ref 1.4–7.0)
Neutrophils: 51 %
Platelets: 270 x10E3/uL (ref 150–450)
RBC: 4.88 x10E6/uL (ref 4.14–5.80)
RDW: 11.6 % (ref 11.6–15.4)
WBC: 5.7 x10E3/uL (ref 3.4–10.8)

## 2024-05-20 LAB — CMP14+EGFR
ALT: 30 IU/L (ref 0–44)
AST: 23 IU/L (ref 0–40)
Albumin: 4.4 g/dL (ref 3.9–4.9)
Alkaline Phosphatase: 82 IU/L (ref 47–123)
BUN/Creatinine Ratio: 15 (ref 10–24)
BUN: 17 mg/dL (ref 8–27)
Bilirubin Total: 0.6 mg/dL (ref 0.0–1.2)
CO2: 24 mmol/L (ref 20–29)
Calcium: 10.1 mg/dL (ref 8.6–10.2)
Chloride: 97 mmol/L (ref 96–106)
Creatinine, Ser: 1.16 mg/dL (ref 0.76–1.27)
Globulin, Total: 3 g/dL (ref 1.5–4.5)
Glucose: 191 mg/dL — ABNORMAL HIGH (ref 70–99)
Potassium: 3.3 mmol/L — ABNORMAL LOW (ref 3.5–5.2)
Sodium: 139 mmol/L (ref 134–144)
Total Protein: 7.4 g/dL (ref 6.0–8.5)
eGFR: 71 mL/min/1.73 (ref >59)

## 2024-05-20 LAB — LIPID PANEL
Chol/HDL Ratio: 3.8 ratio (ref 0.0–5.0)
Cholesterol, Total: 149 mg/dL (ref 100–199)
HDL: 39 mg/dL — ABNORMAL LOW (ref >39)
LDL Chol Calc (NIH): 88 mg/dL (ref 0–99)
Triglycerides: 124 mg/dL (ref 0–149)
VLDL Cholesterol Cal: 22 mg/dL (ref 5–40)

## 2024-05-20 LAB — VITAMIN D 25 HYDROXY (VIT D DEFICIENCY, FRACTURES): Vit D, 25-Hydroxy: 23.9 ng/mL — ABNORMAL LOW (ref 30.0–100.0)

## 2024-05-20 LAB — HEMOGLOBIN A1C
Est. average glucose Bld gHb Est-mCnc: 123 mg/dL
Hgb A1c MFr Bld: 5.9 % — ABNORMAL HIGH (ref 4.8–5.6)

## 2024-05-20 LAB — TSH+FREE T4
Free T4: 1.29 ng/dL (ref 0.82–1.77)
TSH: 1.22 u[IU]/mL (ref 0.450–4.500)

## 2024-05-23 ENCOUNTER — Ambulatory Visit: Payer: Self-pay | Admitting: Family Medicine

## 2024-05-23 DIAGNOSIS — E559 Vitamin D deficiency, unspecified: Secondary | ICD-10-CM

## 2024-05-23 MED ORDER — VITAMIN D (ERGOCALCIFEROL) 1.25 MG (50000 UNIT) PO CAPS: 50000.0000 [IU] | ORAL_CAPSULE | ORAL | 1 refills | Status: DC

## 2024-06-04 ENCOUNTER — Other Ambulatory Visit: Payer: Self-pay | Admitting: Family Medicine

## 2024-06-04 DIAGNOSIS — E559 Vitamin D deficiency, unspecified: Secondary | ICD-10-CM

## 2024-06-04 DIAGNOSIS — R0602 Shortness of breath: Secondary | ICD-10-CM

## 2024-08-05 ENCOUNTER — Ambulatory Visit (INDEPENDENT_AMBULATORY_CARE_PROVIDER_SITE_OTHER)

## 2024-08-05 VITALS — BP 160/92 | Ht 68.0 in | Wt 195.0 lb

## 2024-08-05 DIAGNOSIS — Z1211 Encounter for screening for malignant neoplasm of colon: Secondary | ICD-10-CM

## 2024-08-05 DIAGNOSIS — Z0000 Encounter for general adult medical examination without abnormal findings: Secondary | ICD-10-CM

## 2024-08-05 NOTE — Patient Instructions (Signed)
 Mr. Antonio Small,  Thank you for taking the time for your Medicare Wellness Visit. I appreciate your continued commitment to your health goals. Please review the care plan we discussed, and feel free to reach out if I can assist you further.  Please note that Annual Wellness Visits do not include a physical exam. Some assessments may be limited, especially if the visit was conducted virtually. If needed, we may recommend an in-person follow-up with your provider.  Ongoing Care Seeing your primary care provider every 3 to 6 months helps us  monitor your health and provide consistent, personalized care.   1 year follow up for Medicare well visit: Monday August 10, 2025 at 9:20 am with medicare wellness nurse in office  Referrals If a referral was made during today's visit and you haven't received any updates within two weeks, please contact the referred provider directly to check on the status.  Cologuard (at home testing) Cologuard was ordered today.If you haven't received your kit in the next 2 weeks, please call: [employer] [phone].   Recommended Screenings:  Health Maintenance  Topic Date Due   Medicare Annual Wellness Visit  Never done   Zoster (Shingles) Vaccine (1 of 2) Never done   Cologuard (Stool DNA test)  05/19/2025*   Screening for Lung Cancer  12/03/2024   Pneumococcal Vaccine for age over 93  Completed   Flu Shot  Completed   Hepatitis C Screening  Completed   HIV Screening  Completed   Hepatitis B Vaccine  Aged Out   HPV Vaccine  Aged Out   Meningitis B Vaccine  Aged Out   DTaP/Tdap/Td vaccine  Discontinued   COVID-19 Vaccine  Discontinued  *Topic was postponed. The date shown is not the original due date.       08/05/2024    9:36 AM  Advanced Directives  Does Patient Have a Medical Advance Directive? No  Would patient like information on creating a medical advance directive? Yes (MAU/Ambulatory/Procedural Areas - Information given)    Vision: Annual  vision screenings are recommended for early detection of glaucoma, cataracts, and diabetic retinopathy. These exams can also reveal signs of chronic conditions such as diabetes and high blood pressure.  Dental: Annual dental screenings help detect early signs of oral cancer, gum disease, and other conditions linked to overall health, including heart disease and diabetes.  Please see the attached documents for additional preventive care recommendations.

## 2024-08-05 NOTE — Progress Notes (Signed)
 "  HM Addressed: Cologuard Ordered Acute visit schedule with Dr. Tobie on August 11, 2024 at 2:20 pm (ok'ed by Dr. Tobie) due to elevated home blood pressure readings. Patient aware of appointment date and time, verbalized understanding, and is in agreement with treatment plan.  Chief Complaint  Patient presents with   Medicare Wellness     Subjective:   Antonio Small is a 63 y.o. male who presents for a Medicare Annual Wellness Visit.  Visit info / Clinical Intake: Medicare Wellness Visit Type:: Subsequent Annual Wellness Visit Persons participating in visit and providing information:: patient Medicare Wellness Visit Mode:: Telephone If telephone:: video error Since this visit was completed virtually, some vitals may be partially provided or unavailable. Missing vitals are due to the limitations of the virtual format.: Documented vitals are patient reported If Telephone or Video please confirm:: I connected with patient using audio/video enable telemedicine. I verified patient identity with two identifiers, discussed telehealth limitations, and patient agreed to proceed. Patient Location:: home Provider Location:: office Interpreter Needed?: No Pre-visit prep was completed: yes AWV questionnaire completed by patient prior to visit?: no Living arrangements:: (!) lives alone Patient's Overall Health Status Rating: good Typical amount of pain: none Does pain affect daily life?: no Are you currently prescribed opioids?: no  Dietary Habits and Nutritional Risks How many meals a day?: 2 Eats fruit and vegetables daily?: yes Most meals are obtained by: preparing own meals In the last 2 weeks, have you had any of the following?: none Diabetic:: no  Functional Status Activities of Daily Living (to include ambulation/medication): Independent Ambulation: Independent Medication Administration: Independent Home Management (perform basic housework or laundry): Independent Manage your  own finances?: yes Primary transportation is: family / friends Concerns about vision?: no *vision screening is required for WTM* Concerns about hearing?: no  Fall Screening Falls in the past year?: 0 Number of falls in past year: 0 Was there an injury with Fall?: 0 Fall Risk Category Calculator: 0 Patient Fall Risk Level: Low Fall Risk  Fall Risk Patient at Risk for Falls Due to: No Fall Risks Fall risk Follow up: Falls evaluation completed; Education provided; Falls prevention discussed  Home and Transportation Safety: All rugs have non-skid backing?: N/A, no rugs All stairs or steps have railings?: N/A, no stairs Grab bars in the bathtub or shower?: yes Have non-skid surface in bathtub or shower?: yes Good home lighting?: yes Regular seat belt use?: yes Hospital stays in the last year:: no  Cognitive Assessment Difficulty concentrating, remembering, or making decisions? : no Will 6CIT or Mini Cog be Completed: yes What year is it?: 0 points What month is it?: 0 points Give patient an address phrase to remember (5 components): [address] [place] About what time is it?: 0 points Count backwards from 20 to 1: 0 points Say the months of the year in reverse: 0 points Repeat the address phrase from earlier: 0 points 6 CIT Score: 0 points  Advance Directives (For Healthcare) Does Patient Have a Medical Advance Directive?: No Would patient like information on creating a medical advance directive?: Yes (MAU/Ambulatory/Procedural Areas - Information given)  Reviewed/Updated  Reviewed/Updated: Reviewed All (Medical, Surgical, Family, Medications, Allergies, Care Teams, Patient Goals)    Allergies (verified) Patient has no known allergies.   Current Medications (verified) Outpatient Encounter Medications as of 08/05/2024  Medication Sig   albuterol  (PROVENTIL ) (2.5 MG/3ML) 0.083% nebulizer solution Take 3 mLs (2.5 mg total) by nebulization every 6 (six) hours as needed  for wheezing or shortness of breath.   albuterol  (VENTOLIN  HFA) 108 (90 Base) MCG/ACT inhaler INHALE 2 PUFFS INTO THE LUNGS EVERY 6 HOURS AS NEEDED FOR WHEEZING OR SHORTNESS OF BREATH   amLODipine  (NORVASC ) 10 MG tablet Take 1 tablet (10 mg total) by mouth daily.   aspirin EC 81 MG tablet Take 81 mg by mouth every morning.   doxycycline  (VIBRAMYCIN ) 100 MG capsule Take 1 capsule (100 mg total) by mouth 2 (two) times daily.   Fluticasone-Umeclidin-Vilant (TRELEGY ELLIPTA ) 100-62.5-25 MCG/ACT AEPB Inhale 1 puff into the lungs daily.   hydrALAZINE  (APRESOLINE ) 10 MG tablet Take 1 tablet (10 mg total) by mouth 3 (three) times daily.   hydrochlorothiazide  (HYDRODIURIL ) 25 MG tablet Take 1 tablet (25 mg total) by mouth daily.   Nebulizers MISC 1 Units by Does not apply route every 4 (four) hours as needed (wheezing).   nystatin  (MYCOSTATIN ) 100000 UNIT/ML suspension Take 5 mLs (500,000 Units total) by mouth 4 (four) times daily.   olmesartan  (BENICAR ) 40 MG tablet Take 1 tablet (40 mg total) by mouth daily.   pravastatin  (PRAVACHOL ) 40 MG tablet Take 40 mg by mouth daily.   Vitamin D , Ergocalciferol , (DRISDOL ) 1.25 MG (50000 UNIT) CAPS capsule TAKE 1 CAPSULE BY MOUTH EVERY 7 DAYS   [DISCONTINUED] predniSONE  (STERAPRED UNI-PAK 21 TAB) 10 MG (21) TBPK tablet Take by mouth daily. Take 6 tabs by mouth daily  for 2 days, then 5 tabs for 2 days, then 4 tabs for 2 days, then 3 tabs for 2 days, 2 tabs for 2 days, then 1 tab by mouth daily for 2 days (Patient not taking: Reported on 08/05/2024)   No facility-administered encounter medications on file as of 08/05/2024.    History: Past Medical History:  Diagnosis Date   Chronic back pain    Hyperlipidemia    Hypertension    Past Surgical History:  Procedure Laterality Date   BACK SURGERY     HERNIA REPAIR Left    INGUINAL HERNIA REPAIR Right 05/13/2020   Procedure: RIGHT INGUINAL HERNIA REPAIR WITH MESH;  Surgeon: Vernetta Berg, MD;  Location:  [facility];  Service: General;  Laterality: Right;   INSERTION OF MESH Right 05/13/2020   Procedure: INSERTION OF MESH;  Surgeon: Vernetta Berg, MD;  Location: [facility];  Service: General;  Laterality: Right;   Family History  Problem Relation Age of Onset   Hypertension Mother    Hypertension Father    Hypertension Sister    Social History   Occupational History   Occupation: Disabled    Comment: d/t back pain  Tobacco Use   Smoking status: Former    Current packs/day: 0.00    Average packs/day: 0.5 packs/day for 40.0 years (20.0 ttl pk-yrs)    Types: Cigarettes    Start date: 08/07/1982    Quit date: 08/07/2022    Years since quitting: 1.9   Smokeless tobacco: Never  Vaping Use   Vaping status: Never Used  Substance and Sexual Activity   Alcohol use: Not Currently    Alcohol/week: 2.0 standard drinks of alcohol    Types: 2 Cans of beer per week    Comment: occassionally   Drug use: No   Sexual activity: Yes   Tobacco Counseling Counseling given: Yes  SDOH Screenings   Food Insecurity: No Food Insecurity (08/05/2024)  Housing: Low Risk (08/05/2024)  Transportation Needs: No Transportation Needs (08/05/2024)  Utilities: Not At Risk (08/05/2024)  Depression (PHQ2-9): Low Risk (08/05/2024)  Physical Activity: Sufficiently Active (08/05/2024)  Social Connections: Moderately Isolated (08/05/2024)  Stress: No Stress Concern Present (08/05/2024)  Tobacco Use: Medium Risk (08/05/2024)  Health Literacy: Adequate Health Literacy (08/05/2024)   See flowsheets for full screening details  Depression Screen PHQ 2 & 9 Depression Scale- Over the past 2 weeks, how often have you been bothered by any of the following problems? Little interest or pleasure in doing things: 0 Feeling down, depressed, or hopeless (PHQ Adolescent also includes...irritable): 0 PHQ-2 Total Score: 0 Trouble falling or staying asleep, or sleeping too much: 0 Feeling  tired or having little energy: 0 Poor appetite or overeating (PHQ Adolescent also includes...weight loss): 0 Feeling bad about yourself - or that you are a failure or have let yourself or your family down: 0 Trouble concentrating on things, such as reading the newspaper or watching television (PHQ Adolescent also includes...like school work): 0 Moving or speaking so slowly that other people could have noticed. Or the opposite - being so fidgety or restless that you have been moving around a lot more than usual: 0 Thoughts that you would be better off dead, or of hurting yourself in some way: 0 PHQ-9 Total Score: 0 If you checked off any problems, how difficult have these problems made it for you to do your work, take care of things at home, or get along with other people?: Not difficult at all  Depression Treatment Depression Interventions/Treatment : EYV7-0 Score <4 Follow-up Not Indicated     Goals Addressed               This Visit's Progress     Remain as active and healthy as possible (pt-stated)               Objective:    Today's Vitals   08/05/24 0929  BP: (!) 160/92  Weight: 195 lb (88.5 kg)  Height: 5' 8 (1.727 m)   Body mass index is 29.65 kg/m.  Hearing/Vision screen Hearing Screening - Comments:: Patient denies any hearing difficulties.   Vision Screening - Comments:: Patient does not have an eye doctor. A list of eye doctors has been provided to the patient.   Immunizations and Health Maintenance Health Maintenance  Topic Date Due   Medicare Annual Wellness (AWV)  Never done   Zoster Vaccines- Shingrix (1 of 2) Never done   Fecal DNA (Cologuard)  05/19/2025 (Originally 03/16/2006)   Lung Cancer Screening  12/03/2024   Pneumococcal Vaccine: 50+ Years  Completed   Influenza Vaccine  Completed   Hepatitis C Screening  Completed   HIV Screening  Completed   Hepatitis B Vaccines 19-59 Average Risk  Aged Out   HPV VACCINES  Aged Out   Meningococcal B  Vaccine  Aged Out   DTaP/Tdap/Td  Discontinued   COVID-19 Vaccine  Discontinued        Assessment/Plan:  This is a routine wellness examination for Antonio Small.  Patient Care Team: Bacchus, Meade PEDLAR, FNP as PCP - General (Family Medicine) Darlean Ozell NOVAK, MD as Consulting Physician (Pulmonary Disease) Ruthell Lauraine FALCON, NP as Nurse Practitioner (Pulmonary Disease)  I have personally reviewed and noted the following in the patients chart:   Medical and social history Use of alcohol, tobacco or illicit drugs  Current medications and supplements including opioid prescriptions. Functional ability and status Nutritional status Physical activity Advanced directives List of other physicians Hospitalizations, surgeries, and ER visits in previous 12 months Vitals Screenings to include cognitive, depression, and falls Referrals and  appointments  Orders Placed This Encounter  Procedures   Cologuard   In addition, I have reviewed and discussed with patient certain preventive protocols, quality metrics, and best practice recommendations. A written personalized care plan for preventive services as well as general preventive health recommendations were provided to patient.   Alton Tremblay, CMA   08/05/2024   Return for Monday August 10, 2025 at 9:20 am for your yearly Medicare Wellness visit IN PERSON .  After Visit Summary: (Mail) Due to this being a telephonic visit, the after visit summary with patients personalized plan was offered to patient via mail   "

## 2024-08-11 ENCOUNTER — Ambulatory Visit (INDEPENDENT_AMBULATORY_CARE_PROVIDER_SITE_OTHER): Payer: Self-pay | Admitting: Internal Medicine

## 2024-08-11 ENCOUNTER — Encounter: Payer: Self-pay | Admitting: Internal Medicine

## 2024-08-11 VITALS — BP 138/87 | HR 91 | Ht 68.0 in | Wt 195.6 lb

## 2024-08-11 DIAGNOSIS — I10 Essential (primary) hypertension: Secondary | ICD-10-CM | POA: Diagnosis not present

## 2024-08-11 DIAGNOSIS — E782 Mixed hyperlipidemia: Secondary | ICD-10-CM

## 2024-08-11 NOTE — Patient Instructions (Signed)
Please continue to take medications as prescribed.  Please continue to follow low salt diet and perform moderate exercise/walking at least 150 mins/week. 

## 2024-08-11 NOTE — Progress Notes (Signed)
 "  Established Patient Office Visit  Subjective:  Patient ID: Antonio Small, male    DOB: 03-29-1961  Age: 64 y.o. MRN: [id number]  CC:  Chief Complaint  Patient presents with   Hypertension    Blood pressure concerns, reports bp has been reading high at home.     HPI Antonio Small is a 64 y.o. male with past medical history of HTN, HLD and asthma who presents for f/u of HTN.  HTN: He had elevated BP at home recently with automatic wrist cuff, around 150s/90s.  He is currently taking amlodipine  10 mg QD, olmesartan  40 mg QD, HCTZ 25 mg QD and hydralazine  10 mg 3 times daily.  His BP was initially elevated today, but improved later.  Denies any headache, dizziness, chest pain, dyspnea or palpitations.  Past Medical History:  Diagnosis Date   Chronic back pain    Hyperlipidemia    Hypertension     Past Surgical History:  Procedure Laterality Date   BACK SURGERY     HERNIA REPAIR Left    INGUINAL HERNIA REPAIR Right 05/13/2020   Procedure: RIGHT INGUINAL HERNIA REPAIR WITH MESH;  Surgeon: Vernetta Berg, MD;  Location: [facility];  Service: General;  Laterality: Right;   INSERTION OF MESH Right 05/13/2020   Procedure: INSERTION OF MESH;  Surgeon: Vernetta Berg, MD;  Location: [facility];  Service: General;  Laterality: Right;    Family History  Problem Relation Age of Onset   Hypertension Mother    Hypertension Father    Hypertension Sister     Social History   Socioeconomic History   Marital status: Legally Separated    Spouse name: Not on file   Number of children: 1   Years of education: Not on file   Highest education level: Not on file  Occupational History   Occupation: Disabled    Comment: d/t back pain  Tobacco Use   Smoking status: Former    Current packs/day: 0.00    Average packs/day: 0.5 packs/day for 40.0 years (20.0 ttl pk-yrs)    Types: Cigarettes    Start date: 08/07/1982    Quit date: 08/07/2022    Years  since quitting: 2.0   Smokeless tobacco: Never  Vaping Use   Vaping status: Never Used  Substance and Sexual Activity   Alcohol use: Not Currently    Alcohol/week: 2.0 standard drinks of alcohol    Types: 2 Cans of beer per week    Comment: occassionally   Drug use: No   Sexual activity: Yes  Other Topics Concern   Not on file  Social History Narrative   1 daughter   Social Drivers of Health   Tobacco Use: Medium Risk (08/11/2024)   Patient History    Smoking Tobacco Use: Former    Smokeless Tobacco Use: Never    Passive Exposure: Not on Actuary Strain: Not on file  Food Insecurity: No Food Insecurity (08/05/2024)   Epic    Worried About Programme Researcher, Broadcasting/film/video in the Last Year: Never true    Ran Out of Food in the Last Year: Never true  Transportation Needs: No Transportation Needs (08/05/2024)   Epic    Lack of Transportation (Medical): No    Lack of Transportation (Non-Medical): No  Physical Activity: Sufficiently Active (08/05/2024)   Exercise Vital Sign    Days of Exercise per Week: 7 days    Minutes of Exercise per Session: 30 min  Stress: No Stress Concern Present (08/05/2024)   [employer] of Occupational Health - Occupational Stress Questionnaire    Feeling of Stress: Not at all  Social Connections: Moderately Isolated (08/05/2024)   Social Connection and Isolation Panel    Frequency of Communication with Friends and Family: More than three times a week    Frequency of Social Gatherings with Friends and Family: More than three times a week    Attends Religious Services: More than 4 times per year    Active Member of [employer] or Organizations: No    Attends Banker Meetings: Never    Marital Status: Divorced  Catering Manager Violence: Not At Risk (08/05/2024)   Epic    Fear of Current or Ex-Partner: No    Emotionally Abused: No    Physically Abused: No    Sexually Abused: No  Depression (PHQ2-9): Low Risk (08/11/2024)    Depression (PHQ2-9)    PHQ-2 Score: 0  Alcohol Screen: Not on file  Housing: Low Risk (08/05/2024)   Epic    Unable to Pay for Housing in the Last Year: No    Number of Times Moved in the Last Year: 1    Homeless in the Last Year: No  Utilities: Not At Risk (08/05/2024)   Epic    Threatened with loss of utilities: No  Health Literacy: Adequate Health Literacy (08/05/2024)   B1300 Health Literacy    Frequency of need for help with medical instructions: Never    Outpatient Medications Prior to Visit  Medication Sig Dispense Refill   albuterol  (PROVENTIL ) (2.5 MG/3ML) 0.083% nebulizer solution Take 3 mLs (2.5 mg total) by nebulization every 6 (six) hours as needed for wheezing or shortness of breath. 75 mL 12   albuterol  (VENTOLIN  HFA) 108 (90 Base) MCG/ACT inhaler INHALE 2 PUFFS INTO THE LUNGS EVERY 6 HOURS AS NEEDED FOR WHEEZING OR SHORTNESS OF BREATH 6.7 g 0   amLODipine  (NORVASC ) 10 MG tablet Take 1 tablet (10 mg total) by mouth daily. 90 tablet 3   aspirin EC 81 MG tablet Take 81 mg by mouth every morning.     Fluticasone-Umeclidin-Vilant (TRELEGY ELLIPTA ) 100-62.5-25 MCG/ACT AEPB Inhale 1 puff into the lungs daily. 1 each 11   hydrALAZINE  (APRESOLINE ) 10 MG tablet Take 1 tablet (10 mg total) by mouth 3 (three) times daily. 120 tablet 1   hydrochlorothiazide  (HYDRODIURIL ) 25 MG tablet Take 1 tablet (25 mg total) by mouth daily. 90 tablet 1   Nebulizers MISC 1 Units by Does not apply route every 4 (four) hours as needed (wheezing). 1 Units 0   nystatin  (MYCOSTATIN ) 100000 UNIT/ML suspension Take 5 mLs (500,000 Units total) by mouth 4 (four) times daily. 60 mL 5   olmesartan  (BENICAR ) 40 MG tablet Take 1 tablet (40 mg total) by mouth daily. 90 tablet 1   pravastatin  (PRAVACHOL ) 40 MG tablet Take 40 mg by mouth daily.     Vitamin D , Ergocalciferol , (DRISDOL ) 1.25 MG (50000 UNIT) CAPS capsule TAKE 1 CAPSULE BY MOUTH EVERY 7 DAYS 10 capsule 0   doxycycline  (VIBRAMYCIN ) 100 MG capsule Take  1 capsule (100 mg total) by mouth 2 (two) times daily. 10 capsule 0   No facility-administered medications prior to visit.    Allergies[1]  ROS Review of Systems  Constitutional:  Negative for chills and fever.  HENT:  Negative for congestion and sore throat.   Eyes:  Negative for pain and discharge.  Respiratory:  Negative for cough and shortness of breath.  Cardiovascular:  Negative for chest pain and palpitations.  Gastrointestinal:  Negative for diarrhea, nausea and vomiting.  Endocrine: Negative for polydipsia and polyuria.  Genitourinary:  Negative for dysuria and hematuria.  Musculoskeletal:  Negative for neck pain and neck stiffness.  Skin:  Negative for rash.  Neurological:  Negative for dizziness, weakness, numbness and headaches.  Psychiatric/Behavioral:  Negative for agitation and behavioral problems.       Objective:    Physical Exam Vitals reviewed.  Constitutional:      General: He is not in acute distress.    Appearance: He is not diaphoretic.  HENT:     Head: Normocephalic and atraumatic.     Nose: Nose normal.     Mouth/Throat:     Mouth: Mucous membranes are moist.  Eyes:     General: No scleral icterus.    Extraocular Movements: Extraocular movements intact.  Cardiovascular:     Rate and Rhythm: Normal rate and regular rhythm.     Heart sounds: Normal heart sounds. No murmur heard. Pulmonary:     Breath sounds: Normal breath sounds. No wheezing or rales.  Musculoskeletal:     Cervical back: Neck supple. No tenderness.     Right lower leg: No edema.     Left lower leg: No edema.  Skin:    General: Skin is warm.     Findings: No rash.  Neurological:     General: No focal deficit present.     Mental Status: He is alert and oriented to person, place, and time.  Psychiatric:        Mood and Affect: Mood normal.        Behavior: Behavior normal.     BP 138/87   Pulse 91   Ht 5' 8 (1.727 m)   Wt 195 lb 9.6 oz (88.7 kg)   SpO2 92%   BMI  29.74 kg/m  Wt Readings from Last 3 Encounters:  08/11/24 195 lb 9.6 oz (88.7 kg)  08/05/24 195 lb (88.5 kg)  05/19/24 195 lb 0.6 oz (88.5 kg)    Lab Results  Component Value Date   TSH 1.220 05/19/2024   Lab Results  Component Value Date   WBC 5.7 05/19/2024   HGB 15.6 05/19/2024   HCT 47.3 05/19/2024   MCV 97 05/19/2024   PLT 270 05/19/2024   Lab Results  Component Value Date   NA 139 05/19/2024   K 3.3 (L) 05/19/2024   CO2 24 05/19/2024   GLUCOSE 191 (H) 05/19/2024   BUN 17 05/19/2024   CREATININE 1.16 05/19/2024   BILITOT 0.6 05/19/2024   ALKPHOS 82 05/19/2024   AST 23 05/19/2024   ALT 30 05/19/2024   PROT 7.4 05/19/2024   ALBUMIN 4.4 05/19/2024   CALCIUM 10.1 05/19/2024   ANIONGAP 14 03/16/2024   EGFR 71 05/19/2024   Lab Results  Component Value Date   CHOL 149 05/19/2024   Lab Results  Component Value Date   HDL 39 (L) 05/19/2024   Lab Results  Component Value Date   LDLCALC 88 05/19/2024   Lab Results  Component Value Date   TRIG 124 05/19/2024   Lab Results  Component Value Date   CHOLHDL 3.8 05/19/2024   Lab Results  Component Value Date   HGBA1C 5.9 (H) 05/19/2024      Assessment & Plan:   Problem List Items Addressed This Visit       Cardiovascular and Mediastinum   Hypertension, essential - Primary   BP  Readings from Last 1 Encounters:  08/11/24 138/87   Well-controlled with amlodipine  10 mg QD, olmesartan  40 mg QD, HCTZ 25 mg QD and hydralazine  10 mg 3 times daily Counseled for compliance with the medications Advised DASH diet and moderate exercise/walking, at least 150 mins/week        Other   HLD (hyperlipidemia)   On pravastatin  40 mg QD Last lipid profile reviewed       No orders of the defined types were placed in this encounter.   Follow-up: Return in about 5 months (around 01/09/2025).    Suzzane MARLA Blanch, MD     [1] No Known Allergies  "

## 2024-08-12 NOTE — Assessment & Plan Note (Signed)
 On pravastatin  40 mg QD Last lipid profile reviewed

## 2024-08-12 NOTE — Assessment & Plan Note (Signed)
 BP Readings from Last 1 Encounters:  08/11/24 138/87   Well-controlled with amlodipine  10 mg QD, olmesartan  40 mg QD, HCTZ 25 mg QD and hydralazine  10 mg 3 times daily Counseled for compliance with the medications Advised DASH diet and moderate exercise/walking, at least 150 mins/week

## 2024-09-22 ENCOUNTER — Ambulatory Visit: Admitting: Family Medicine

## 2025-01-12 ENCOUNTER — Ambulatory Visit: Payer: Self-pay | Admitting: Internal Medicine

## 2025-08-10 ENCOUNTER — Ambulatory Visit: Payer: Self-pay
